# Patient Record
Sex: Male | Born: 1935 | Race: Asian | Hispanic: No | Marital: Married | Smoking: Never smoker
Health system: Southern US, Community
[De-identification: ages and names within clinical notes are randomized; demographics above are authoritative.]

## PROBLEM LIST (undated history)

## (undated) DIAGNOSIS — M1990 Unspecified osteoarthritis, unspecified site: Secondary | ICD-10-CM

## (undated) DIAGNOSIS — Z952 Presence of prosthetic heart valve: Principal | ICD-10-CM

## (undated) DIAGNOSIS — I77810 Thoracic aortic ectasia: Secondary | ICD-10-CM

## (undated) DIAGNOSIS — E785 Hyperlipidemia, unspecified: Secondary | ICD-10-CM

## (undated) DIAGNOSIS — I10 Essential (primary) hypertension: Secondary | ICD-10-CM

## (undated) DIAGNOSIS — I351 Nonrheumatic aortic (valve) insufficiency: Secondary | ICD-10-CM

## (undated) HISTORY — DX: Unspecified osteoarthritis, unspecified site: M19.90

## (undated) HISTORY — DX: Essential (primary) hypertension: I10

## (undated) HISTORY — DX: Presence of prosthetic heart valve: Z95.2

## (undated) HISTORY — DX: Thoracic aortic ectasia: I77.810

## (undated) HISTORY — DX: Hyperlipidemia, unspecified: E78.5

## (undated) HISTORY — DX: Nonrheumatic aortic (valve) insufficiency: I35.1

---

## 2010-10-29 HISTORY — PX: CARDIAC CATHETERIZATION: SHX172

## 2010-11-07 ENCOUNTER — Inpatient Hospital Stay (HOSPITAL_BASED_OUTPATIENT_CLINIC_OR_DEPARTMENT_OTHER)
Admission: RE | Admit: 2010-11-07 | Discharge: 2010-11-07 | Disposition: A | Discharge status: Home / Self Care | Payer: Medicare Other | Source: Ambulatory Visit | Attending: Cardiology | Admitting: Cardiology

## 2010-11-07 DIAGNOSIS — I2510 Atherosclerotic heart disease of native coronary artery without angina pectoris: Secondary | ICD-10-CM | POA: Insufficient documentation

## 2010-11-07 DIAGNOSIS — R9439 Abnormal result of other cardiovascular function study: Secondary | ICD-10-CM | POA: Insufficient documentation

## 2010-11-07 DIAGNOSIS — N189 Chronic kidney disease, unspecified: Secondary | ICD-10-CM | POA: Insufficient documentation

## 2010-11-07 DIAGNOSIS — I359 Nonrheumatic aortic valve disorder, unspecified: Secondary | ICD-10-CM | POA: Insufficient documentation

## 2010-11-07 DIAGNOSIS — I77819 Aortic ectasia, unspecified site: Secondary | ICD-10-CM | POA: Insufficient documentation

## 2010-11-08 DIAGNOSIS — I77810 Thoracic aortic ectasia: Secondary | ICD-10-CM

## 2010-11-08 DIAGNOSIS — E785 Hyperlipidemia, unspecified: Secondary | ICD-10-CM | POA: Insufficient documentation

## 2010-11-08 DIAGNOSIS — I10 Essential (primary) hypertension: Secondary | ICD-10-CM | POA: Insufficient documentation

## 2010-11-08 DIAGNOSIS — I351 Nonrheumatic aortic (valve) insufficiency: Secondary | ICD-10-CM | POA: Insufficient documentation

## 2010-11-09 ENCOUNTER — Other Ambulatory Visit: Payer: Self-pay | Admitting: Cardiothoracic Surgery

## 2010-11-09 ENCOUNTER — Institutional Professional Consult (permissible substitution) (INDEPENDENT_AMBULATORY_CARE_PROVIDER_SITE_OTHER): Payer: Medicare Other | Admitting: Cardiothoracic Surgery

## 2010-11-09 ENCOUNTER — Encounter: Payer: Self-pay | Admitting: Cardiothoracic Surgery

## 2010-11-09 VITALS — BP 117/72 | HR 109 | Temp 98.8°F | Resp 16 | Ht 66.0 in | Wt 154.0 lb

## 2010-11-09 DIAGNOSIS — I359 Nonrheumatic aortic valve disorder, unspecified: Secondary | ICD-10-CM

## 2010-11-09 DIAGNOSIS — I712 Thoracic aortic aneurysm, without rupture, unspecified: Secondary | ICD-10-CM

## 2010-11-09 DIAGNOSIS — I351 Nonrheumatic aortic (valve) insufficiency: Secondary | ICD-10-CM

## 2010-11-09 NOTE — Progress Notes (Signed)
PCP is No primary provider on file. Referring Provider is Jake Bathe., MD  Chief Complaint  Patient presents with  . Thoracic Aortic Aneurysm    and aortic valvular disease    HPI: I was asked to evaluate this 77 year old [place] male for Merkel therapy recently diagnosed severe aortic insufficiency and a moderate descending aortic fusiform aneurysm. The patient presented for treatment initially of his right knee which has severe arthritis and a total knee replacement was requested. Heart surgery cardiology clearance was performed. The patient was noted to have a cardiac murmur and a 2-D echo was performed showing moderate aortic root dilatation with a diameter of 4.6 cm of the descending aorta, LVEF of 50%, moderate to severe aortic insufficiency, moderate LVH. Patient underwent a stress test which was positive for ischemia in the anterior septal area. A subsequent cardiac catheter with coronary arteriography by Dr. Donato Schultz demonstrated percent stenosis of a septal perforator otherwise no significant coronary disease. The injection of the aortic root demonstrated dilatation of a fusiform aneurysm configuration. She was therefore referred for thoracic surgical evaluation for probable aortic valve replacement and hostile replacement of descending aorta.  Patient has no known history of aortic valve disease, cardiac murmur, history of rheumatic heart disease, or history of arrhythmia angina or CHF. No family history of heart valve disease or heart valve surgery. Patient never smoked.  Past medical history hypertension, hyperlipidemia, known drug allergies, or arthritis of the right knee requiring total knee replacement  Medications simvastatin 40 mg each bedtime losartan 100 mg daily when necessary Tylenol  Social history patient is retired he is never smoker. He drinks alcohol rarely. He was wife. Is retired Psychiatric nurse.  Review of systems constitutional review is negative for fever  weight loss. He review is significant for total dental extraction with total dental plates. Thoracic review is negative for prior thoracic, or recent symptoms of upper respiratory  infection. GI review is negative for history of hepatitis jaundice or blood per rectum. Her logic views negative for symptoms of UTI. Vascular neurologic review is negative for stroke or seizure. review negative for DVT claudication TIA or stroke. Hematologic negative for bleeding disorder or blood transfusion.  Past Medical History  Diagnosis Date  . HTN (hypertension)   . Hyperlipidemia   . Aortic insufficiency   . Aortic root dilatation     No past surgical history on file.  No family history on file.  Social History History  Substance Use Topics  . Smoking status: Never Smoker   . Smokeless tobacco: Not on file  . Alcohol Use: Yes     rare    Current Outpatient Prescriptions  Medication Sig Dispense Refill  . losartan (COZAAR) 100 MG tablet Take 100 mg by mouth daily.        . simvastatin (ZOCOR) 40 MG tablet Take 40 mg by mouth at bedtime.          No Known Allergies  Review of Systems: As above.  BP 117/72  Pulse 109  Temp(Src) 98.8 F (37.1 C) (Oral)  Resp 16  Ht 5\' 6"  (1.676 m)  Wt 154 lb (69.854 kg)  BMI 24.86 kg/m2  SpO2 95% Physical Exam:  General appearance is that of a elderly Asian male accompanied by 2 sons in no acute distress. ENT positive for total dental plates. Neck without JVD mass bruit or adenopathy. Thorax without deformity, breath sounds clear bilaterally. Cardiac exam with regular rhythm with a grade 2/6 systolic murmur. Abdominal exam  soft nontender without pulsatile mass. Extremities reveal no clubbing tenderness or cyanosis. Vascular exam negative for venous insufficiency of the lower chairman knees, positive for palpable pulses in the extremities. Neurologic exam alert and oriented without focal motor deficit. He has a normal  gait.                       Diagnostic Tests: His 2-D echo report and the cardiac catheter are reviewed. He has severe AI, moderate aortic root dilatation, and no significant coronary disease.  Impression: Severe AI with moderate dilatation and a fusiform aneurysm of the ascending aorta. The patient would be best treated with aortic valve replacement using a biologic valve and probable replacement of the ascending aorta with a Dacron graft.  Plan: The patient will return for a CT angiogram of the thoracic aorta and at that time we will schedule the day of surgery.

## 2010-11-09 NOTE — Patient Instructions (Signed)
Continue current meds and return to office after CT scan 9-14

## 2010-11-10 ENCOUNTER — Other Ambulatory Visit: Payer: Self-pay | Admitting: Cardiothoracic Surgery

## 2010-11-10 LAB — BUN: BUN: 29 mg/dL — ABNORMAL HIGH (ref 6–23)

## 2010-11-10 LAB — CREATININE, SERUM: Creat: 1.68 mg/dL — ABNORMAL HIGH (ref 0.50–1.35)

## 2010-11-10 NOTE — Cardiovascular Report (Signed)
Javier Gonzalez, Javier Gonzalez                  ACCOUNT NO.:  [id number]  MEDICAL RECORD NO.:  [id number]  LOCATION:                                 FACILITY:  PHYSICIAN:  Jake Bathe, MD      DATE OF BIRTH:  1931-12-05  DATE OF PROCEDURE:  11/07/2010 DATE OF DISCHARGE:                           CARDIAC CATHETERIZATION   INDICATIONS:  A 77 year old male who was originally seen by me for preoperative knee surgery clearance who had EKG showing nonspecific ST-T wave changes with areas of T-wave inversion in lead V6, V3, possible ischemia who underwent nuclear stress test, which showed some inferior lateral wall ischemia, possible mid anteroseptal wall ischemia with mildly reduced ejection fraction of 50% with septal hypokinesis.  The aortic root on echocardiogram was 4 cm at the site of Valsalva and 4.6 cm at the ascending aorta and there was what looked like moderate-to- severe aortic valve regurgitation.  His creatinine at one point was 1.58, prior to catheterization was 1.2, and bicarb protocol was performed.  PROCEDURE DETAILS:  Informed consent was obtained.  Risk of stroke, heart attack, death, renal impairment, arterial damage, bleeding were discussed with the patient with the aid of the translator.  Alternatives treatments discussed.  Possibility of renal impairment was discussed with family.  The femoral head was viewed via fluoroscopy.  A 4-French sheath was inserted in the right femoral artery without difficulty utilizing 1% lidocaine for local anesthesia.  A Judkins left #4 catheter did not fit within the left main artery due to aortic root dilatation. This was then changed to a JL-5, which successfully cannulated the left main artery.  Multiple views with hand injection of Omnipaque were obtained.  No torque Williams right catheter was used to selectively cannulate the right coronary artery.  Multiple views with hand injection of Omnipaque were obtained.  Angled pigtail was used  to cross the left ventricle.  A left ventriculogram utilizing 10 mL of contrast was performed.  Pullback was obtained then and aortogram involving the root was performed to assess his aortic insufficiency as well as aortic root size.  30 mL of contrast was utilized.  Following procedure, catheters were removed, sheaths were removed, and he was hemodynamically stable. Translator was present for assistance.  10 mg of hydralazine was administered because of blood pressures in the 170s to 190s systolic.  FINDINGS: 1. Left main artery - branches into the circumflex as well as LAD.     There is no angiographically significant disease present. 2. LAD.  This vessel gives rise to 2 diagonal branches.  There are two     large septal branches, the second of which is just prior to the     bifurcation of the first diagonal branch.  This second septal     branch appears to have an 80% ostial stenosis.  Stenting in this     region would jeopardize the remainder of the LAD distribution. 3. Circumflex artery.  This vessel is widely patent with minor luminal     irregularities giving rise to three large obtuse marginal branches.     No angiographically significant disease is present.  Right coronary  artery is also large, tortuous, and gives rise to the posterior     descending artery as well as two large posterior lateral branches.     This is the dominant vessel.  There are minor luminal     irregularities throughout this vasculature.  LEFT VENTRICULOGRAM: 1. EF appears approximately 60%.  Left ventricle was underfilled due     to only 10 mL of use. 2. Aortogram at root.  Significant findings are that the left     ventricle opacifies within one to two beats, which is significant     for 4+ aortic regurgitation.  His aortic root is also dilated, note     46 mm on echocardiogram.  His distal aorta is tortuous.  I did not     performed distal aortogram due to dye-sparing procedure.  Total      approximate IV dye usage was 70 mL.  TOTAL FLUORO TIME:  5.5 minutes.  HEMODYNAMICS:  Left ventricular systolic pressure 180 with an end- diastolic pressure of 19 mmHg.  Aortic pressure 181/99 with a mean of 135 mmHg.  There is no gradient.  IMPRESSION: 1. Second septal branch of left anterior descending distribution has     approximately 80% stenosis.  This may be responsible for the     anteroseptal wall mid ischemia seen on nuclear stress test.     Otherwise, coronary arteries do not demonstrate any significant     luminal abnormalities. 2. Severe aortic regurgitation with left ventricle filling within the     first one to two beats with aortogram.  Normal left ventricular     ejection fraction. 3. Dilated aortic root - echocardiogram demonstrated 46 mm. 4. Chronic kidney disease - creatinine prior to procedure was 1.26.     Previously, he had a creatinine in the 1.5 range.  Treated with     bicarbonate.  I will see him back in clinic in 1 week for followup.  With his shortness of breath, aortic insufficiency, and dilated aortic root, I will consult TCTS for further recommendations.  It may be necessary to perform a transesophageal echocardiogram.  They may also wish to perform CT angiogram of his aorta to fully illustrate and measure.  For now, postpone knee surgery.     Jake Bathe, MD     MCS/MEDQ  D:  11/07/2010  T:  11/07/2010  Job:  [id number]  Electronically Signed by Donato Schultz MD on 11/10/2010 06:15:43 AM

## 2010-11-11 ENCOUNTER — Ambulatory Visit
Admission: RE | Admit: 2010-11-11 | Discharge: 2010-11-11 | Disposition: A | Discharge status: Home / Self Care | Payer: Medicare Other | Source: Ambulatory Visit | Attending: Cardiothoracic Surgery | Admitting: Cardiothoracic Surgery

## 2010-11-11 ENCOUNTER — Ambulatory Visit (INDEPENDENT_AMBULATORY_CARE_PROVIDER_SITE_OTHER): Payer: Medicare Other | Admitting: Cardiothoracic Surgery

## 2010-11-11 VITALS — BP 127/72 | HR 92 | Resp 18 | Ht 66.0 in | Wt 154.0 lb

## 2010-11-11 DIAGNOSIS — I359 Nonrheumatic aortic valve disorder, unspecified: Secondary | ICD-10-CM

## 2010-11-11 DIAGNOSIS — I712 Thoracic aortic aneurysm, without rupture: Secondary | ICD-10-CM

## 2010-11-11 DIAGNOSIS — I77819 Aortic ectasia, unspecified site: Secondary | ICD-10-CM

## 2010-11-11 MED ORDER — IOHEXOL 300 MG/ML  SOLN
50.0000 mL | Freq: Once | INTRAMUSCULAR | Status: AC | PRN
  Administered 2010-11-11: 50 mL via INTRAVENOUS

## 2010-11-11 NOTE — Progress Notes (Signed)
HPI the patient is a 77 year old [place] and yard male with hypertension severe AI and a 4.8 cm descending to form aneurysm. His creatinine is 1.6 and he has mild class III symptoms of CHF. He has severe arthritis in his right knee and is pending right knee replacement.  He returns now for followup after review of his CT angiogram of the thoracic aorta. This demonstrates the fusiform aneurysm. The maximum diameter is 4.8 cm. He arch and descending thoracic aorta appeared normal. The sinuses of Valsalva are not dilated.  Based on these findings he would probably be best treated with an aVR (tissue) and replacement of the ascending aorta with a Dacron graft.   Current Outpatient Prescriptions  Medication Sig Dispense Refill  . losartan (COZAAR) 100 MG tablet Take 100 mg by mouth daily.        . simvastatin (ZOCOR) 40 MG tablet Take 40 mg by mouth at bedtime.         No current facility-administered medications for this visit.   Facility-Administered Medications Ordered in Other Visits  Medication Dose Route Frequency Provider Last Rate Last Dose  . iohexol (OMNIPAQUE) 300 MG/ML injection 50 mL  50 mL Intravenous Once PRN Medication Radiologist   50 mL at 11/11/10 1453     Review of Systems: No new issues.   Physical Exam blood pressure 127/72 is 90 and regular he is in no distress. Breath sounds are clear equal. Pulses are 2+ in all extremities. He has a soft systolic murmur grade 2/6. Oral is intact.   Diagnostic Tests: CT scan of the chest with IV contrast is reviewed as above.   Impression: Severe aortic insufficiency with moderate descending fusiform aneurysm  Plan: Aortic valve replacement with a tissue valve and probable placement of the ascending aorta with a straight graft on September 21. The procedure indications benefits risks and alternatives were discussed with the patient through his grandson as an interpreter.

## 2010-11-11 NOTE — Patient Instructions (Addendum)
take your blood pressure pill with sip of water before coming to hospital Sept 21st.

## 2010-11-16 ENCOUNTER — Encounter (HOSPITAL_COMMUNITY)
Admission: RE | Admit: 2010-11-16 | Discharge: 2010-11-16 | Disposition: A | Discharge status: Home / Self Care | Payer: Medicare Other | Source: Ambulatory Visit | Attending: Cardiothoracic Surgery | Admitting: Cardiothoracic Surgery

## 2010-11-16 ENCOUNTER — Other Ambulatory Visit: Payer: Self-pay | Admitting: Cardiothoracic Surgery

## 2010-11-16 ENCOUNTER — Ambulatory Visit (HOSPITAL_COMMUNITY)
Admission: RE | Admit: 2010-11-16 | Discharge: 2010-11-16 | Disposition: A | Discharge status: Home / Self Care | Payer: Medicare Other | Source: Ambulatory Visit

## 2010-11-16 ENCOUNTER — Ambulatory Visit (HOSPITAL_COMMUNITY)
Admission: RE | Admit: 2010-11-16 | Discharge: 2010-11-16 | Disposition: A | Discharge status: Home / Self Care | Payer: Medicare Other | Source: Ambulatory Visit | Attending: Cardiothoracic Surgery | Admitting: Cardiothoracic Surgery

## 2010-11-16 DIAGNOSIS — Z01812 Encounter for preprocedural laboratory examination: Secondary | ICD-10-CM | POA: Insufficient documentation

## 2010-11-16 DIAGNOSIS — I359 Nonrheumatic aortic valve disorder, unspecified: Secondary | ICD-10-CM | POA: Insufficient documentation

## 2010-11-16 DIAGNOSIS — I351 Nonrheumatic aortic (valve) insufficiency: Secondary | ICD-10-CM

## 2010-11-16 DIAGNOSIS — Z01810 Encounter for preprocedural cardiovascular examination: Secondary | ICD-10-CM

## 2010-11-16 DIAGNOSIS — I2510 Atherosclerotic heart disease of native coronary artery without angina pectoris: Secondary | ICD-10-CM | POA: Insufficient documentation

## 2010-11-16 LAB — ABO/RH: ABO/RH(D): AB POS

## 2010-11-16 LAB — CBC
HCT: 37.9 % — ABNORMAL LOW (ref 39.0–52.0)
Hemoglobin: 12.6 g/dL — ABNORMAL LOW (ref 13.0–17.0)
MCH: 23.7 pg — ABNORMAL LOW (ref 26.0–34.0)
MCHC: 33.2 g/dL (ref 30.0–36.0)
MCV: 71.4 fL — ABNORMAL LOW (ref 78.0–100.0)
Platelets: 161 10*3/uL (ref 150–400)
RBC: 5.31 MIL/uL (ref 4.22–5.81)
RDW: 15.2 % (ref 11.5–15.5)
WBC: 5.5 10*3/uL (ref 4.0–10.5)

## 2010-11-16 LAB — BLOOD GAS, ARTERIAL
Acid-Base Excess: 0 mmol/L (ref 0.0–2.0)
Bicarbonate: 23.7 mEq/L (ref 20.0–24.0)
Drawn by: 644381
FIO2: 0.21 %
O2 Saturation: 97.3 %
Patient temperature: 98.6
TCO2: 24.7 mmol/L (ref 0–100)
pCO2 arterial: 35.3 mmHg (ref 35.0–45.0)
pH, Arterial: 7.441 (ref 7.350–7.450)
pO2, Arterial: 83.3 mmHg (ref 80.0–100.0)

## 2010-11-16 LAB — URINALYSIS, ROUTINE W REFLEX MICROSCOPIC
Bilirubin Urine: NEGATIVE
Glucose, UA: NEGATIVE mg/dL
Ketones, ur: NEGATIVE mg/dL
Leukocytes, UA: NEGATIVE
Nitrite: NEGATIVE
Protein, ur: NEGATIVE mg/dL
Specific Gravity, Urine: 1.019 (ref 1.005–1.030)
Urobilinogen, UA: 0.2 mg/dL (ref 0.0–1.0)
pH: 5 (ref 5.0–8.0)

## 2010-11-16 LAB — URINE MICROSCOPIC-ADD ON

## 2010-11-16 LAB — COMPREHENSIVE METABOLIC PANEL
ALT: 15 U/L (ref 0–53)
AST: 15 U/L (ref 0–37)
Albumin: 3.7 g/dL (ref 3.5–5.2)
Alkaline Phosphatase: 48 U/L (ref 39–117)
BUN: 23 mg/dL (ref 6–23)
CO2: 23 mEq/L (ref 19–32)
Calcium: 9.4 mg/dL (ref 8.4–10.5)
Chloride: 106 mEq/L (ref 96–112)
Creatinine, Ser: 1.45 mg/dL — ABNORMAL HIGH (ref 0.50–1.35)
GFR calc Af Amer: 57 mL/min — ABNORMAL LOW (ref >60)
GFR calc non Af Amer: 47 mL/min — ABNORMAL LOW (ref >60)
Glucose, Bld: 127 mg/dL — ABNORMAL HIGH (ref 70–99)
Potassium: 3.9 mEq/L (ref 3.5–5.1)
Sodium: 139 mEq/L (ref 135–145)
Total Bilirubin: 0.5 mg/dL (ref 0.3–1.2)
Total Protein: 7.3 g/dL (ref 6.0–8.3)

## 2010-11-16 LAB — PROTIME-INR
INR: 1.04 (ref 0.00–1.49)
Prothrombin Time: 13.8 seconds (ref 11.6–15.2)

## 2010-11-16 LAB — APTT: aPTT: 28 seconds (ref 24–37)

## 2010-11-16 LAB — SURGICAL PCR SCREEN
MRSA, PCR: NEGATIVE
Staphylococcus aureus: NEGATIVE

## 2010-11-16 LAB — PULMONARY FUNCTION TEST

## 2010-11-17 HISTORY — PX: TRANSTHORACIC ECHOCARDIOGRAM: SHX275

## 2010-11-17 LAB — HEMOGLOBIN A1C
Hgb A1c MFr Bld: 7.1 % — ABNORMAL HIGH (ref <5.7)
Mean Plasma Glucose: 157 mg/dL — ABNORMAL HIGH (ref <117)

## 2010-11-18 ENCOUNTER — Other Ambulatory Visit: Payer: Self-pay | Admitting: Cardiothoracic Surgery

## 2010-11-18 ENCOUNTER — Inpatient Hospital Stay (HOSPITAL_COMMUNITY): Payer: Medicare Other

## 2010-11-18 ENCOUNTER — Inpatient Hospital Stay (HOSPITAL_COMMUNITY)
Admission: RE | Admit: 2010-11-18 | Discharge: 2010-11-28 | DRG: 220 | Disposition: A | Discharge status: Home Health | Payer: Medicare Other | Source: Ambulatory Visit | Attending: Cardiothoracic Surgery | Admitting: Cardiothoracic Surgery

## 2010-11-18 DIAGNOSIS — K224 Dyskinesia of esophagus: Secondary | ICD-10-CM | POA: Diagnosis not present

## 2010-11-18 DIAGNOSIS — T462X5A Adverse effect of other antidysrhythmic drugs, initial encounter: Secondary | ICD-10-CM | POA: Diagnosis not present

## 2010-11-18 DIAGNOSIS — I712 Thoracic aortic aneurysm, without rupture, unspecified: Secondary | ICD-10-CM | POA: Diagnosis present

## 2010-11-18 DIAGNOSIS — D62 Acute posthemorrhagic anemia: Secondary | ICD-10-CM | POA: Diagnosis not present

## 2010-11-18 DIAGNOSIS — I2510 Atherosclerotic heart disease of native coronary artery without angina pectoris: Secondary | ICD-10-CM | POA: Diagnosis present

## 2010-11-18 DIAGNOSIS — D696 Thrombocytopenia, unspecified: Secondary | ICD-10-CM | POA: Diagnosis not present

## 2010-11-18 DIAGNOSIS — Z79899 Other long term (current) drug therapy: Secondary | ICD-10-CM

## 2010-11-18 DIAGNOSIS — Z01818 Encounter for other preprocedural examination: Secondary | ICD-10-CM

## 2010-11-18 DIAGNOSIS — Z01810 Encounter for preprocedural cardiovascular examination: Secondary | ICD-10-CM

## 2010-11-18 DIAGNOSIS — I4891 Unspecified atrial fibrillation: Secondary | ICD-10-CM | POA: Diagnosis not present

## 2010-11-18 DIAGNOSIS — K644 Residual hemorrhoidal skin tags: Secondary | ICD-10-CM | POA: Diagnosis not present

## 2010-11-18 DIAGNOSIS — I519 Heart disease, unspecified: Secondary | ICD-10-CM | POA: Diagnosis not present

## 2010-11-18 DIAGNOSIS — I359 Nonrheumatic aortic valve disorder, unspecified: Principal | ICD-10-CM | POA: Diagnosis present

## 2010-11-18 DIAGNOSIS — E8779 Other fluid overload: Secondary | ICD-10-CM | POA: Diagnosis not present

## 2010-11-18 DIAGNOSIS — I2789 Other specified pulmonary heart diseases: Secondary | ICD-10-CM | POA: Diagnosis present

## 2010-11-18 DIAGNOSIS — Z01812 Encounter for preprocedural laboratory examination: Secondary | ICD-10-CM

## 2010-11-18 DIAGNOSIS — E119 Type 2 diabetes mellitus without complications: Secondary | ICD-10-CM | POA: Diagnosis present

## 2010-11-18 DIAGNOSIS — I10 Essential (primary) hypertension: Secondary | ICD-10-CM | POA: Diagnosis present

## 2010-11-18 DIAGNOSIS — Z7982 Long term (current) use of aspirin: Secondary | ICD-10-CM

## 2010-11-18 DIAGNOSIS — R112 Nausea with vomiting, unspecified: Secondary | ICD-10-CM | POA: Diagnosis not present

## 2010-11-18 DIAGNOSIS — R1319 Other dysphagia: Secondary | ICD-10-CM | POA: Diagnosis not present

## 2010-11-18 DIAGNOSIS — Z952 Presence of prosthetic heart valve: Secondary | ICD-10-CM

## 2010-11-18 DIAGNOSIS — Y9210 Unspecified residential institution as the place of occurrence of the external cause: Secondary | ICD-10-CM | POA: Diagnosis not present

## 2010-11-18 DIAGNOSIS — E785 Hyperlipidemia, unspecified: Secondary | ICD-10-CM | POA: Diagnosis present

## 2010-11-18 DIAGNOSIS — Y834 Other reconstructive surgery as the cause of abnormal reaction of the patient, or of later complication, without mention of misadventure at the time of the procedure: Secondary | ICD-10-CM | POA: Diagnosis not present

## 2010-11-18 HISTORY — PX: AORTIC VALVE REPLACEMENT: SHX41

## 2010-11-18 HISTORY — DX: Presence of prosthetic heart valve: Z95.2

## 2010-11-18 LAB — POCT I-STAT 4, (NA,K, GLUC, HGB,HCT)
Glucose, Bld: 115 mg/dL — ABNORMAL HIGH (ref 70–99)
Glucose, Bld: 126 mg/dL — ABNORMAL HIGH (ref 70–99)
Glucose, Bld: 126 mg/dL — ABNORMAL HIGH (ref 70–99)
Glucose, Bld: 139 mg/dL — ABNORMAL HIGH (ref 70–99)
Glucose, Bld: 154 mg/dL — ABNORMAL HIGH (ref 70–99)
Glucose, Bld: 153 mg/dL — ABNORMAL HIGH (ref 70–99)
HCT: 38 % — ABNORMAL LOW (ref 39.0–52.0)
HCT: 27 % — ABNORMAL LOW (ref 39.0–52.0)
HCT: 36 % — ABNORMAL LOW (ref 39.0–52.0)
HCT: 27 % — ABNORMAL LOW (ref 39.0–52.0)
HCT: 27 % — ABNORMAL LOW (ref 39.0–52.0)
HCT: 30 % — ABNORMAL LOW (ref 39.0–52.0)
Hemoglobin: 12.9 g/dL — ABNORMAL LOW (ref 13.0–17.0)
Hemoglobin: 12.2 g/dL — ABNORMAL LOW (ref 13.0–17.0)
Hemoglobin: 9.2 g/dL — ABNORMAL LOW (ref 13.0–17.0)
Hemoglobin: 9.2 g/dL — ABNORMAL LOW (ref 13.0–17.0)
Hemoglobin: 9.2 g/dL — ABNORMAL LOW (ref 13.0–17.0)
Hemoglobin: 10.2 g/dL — ABNORMAL LOW (ref 13.0–17.0)
Potassium: 3.9 mEq/L (ref 3.5–5.1)
Potassium: 3.8 mEq/L (ref 3.5–5.1)
Potassium: 3.9 mEq/L (ref 3.5–5.1)
Potassium: 5.2 mEq/L — ABNORMAL HIGH (ref 3.5–5.1)
Potassium: 5 mEq/L (ref 3.5–5.1)
Potassium: 4 mEq/L (ref 3.5–5.1)
Sodium: 141 mEq/L (ref 135–145)
Sodium: 134 mEq/L — ABNORMAL LOW (ref 135–145)
Sodium: 140 mEq/L (ref 135–145)
Sodium: 135 mEq/L (ref 135–145)
Sodium: 134 mEq/L — ABNORMAL LOW (ref 135–145)
Sodium: 139 mEq/L (ref 135–145)

## 2010-11-18 LAB — POCT I-STAT 3, ART BLOOD GAS (G3+)
Acid-Base Excess: 2 mmol/L (ref 0.0–2.0)
Acid-base deficit: 5 mmol/L — ABNORMAL HIGH (ref 0.0–2.0)
Bicarbonate: 26.6 mEq/L — ABNORMAL HIGH (ref 20.0–24.0)
Bicarbonate: 23.3 mEq/L (ref 20.0–24.0)
Bicarbonate: 20.4 mEq/L (ref 20.0–24.0)
Bicarbonate: 24.7 mEq/L — ABNORMAL HIGH (ref 20.0–24.0)
O2 Saturation: 100 %
O2 Saturation: 100 %
O2 Saturation: 93 %
O2 Saturation: 97 %
Patient temperature: 35.9
Patient temperature: 36.5
TCO2: 28 mmol/L (ref 0–100)
TCO2: 24 mmol/L (ref 0–100)
TCO2: 22 mmol/L (ref 0–100)
TCO2: 26 mmol/L (ref 0–100)
pCO2 arterial: 40.1 mmHg (ref 35.0–45.0)
pCO2 arterial: 33.9 mmHg — ABNORMAL LOW (ref 35.0–45.0)
pCO2 arterial: 36.2 mmHg (ref 35.0–45.0)
pCO2 arterial: 41 mmHg (ref 35.0–45.0)
pH, Arterial: 7.429 (ref 7.350–7.450)
pH, Arterial: 7.444 (ref 7.350–7.450)
pH, Arterial: 7.353 (ref 7.350–7.450)
pH, Arterial: 7.386 (ref 7.350–7.450)
pO2, Arterial: 398 mmHg — ABNORMAL HIGH (ref 80.0–100.0)
pO2, Arterial: 263 mmHg — ABNORMAL HIGH (ref 80.0–100.0)
pO2, Arterial: 65 mmHg — ABNORMAL LOW (ref 80.0–100.0)
pO2, Arterial: 92 mmHg (ref 80.0–100.0)

## 2010-11-18 LAB — PROTIME-INR
INR: 1.62 — ABNORMAL HIGH (ref 0.00–1.49)
Prothrombin Time: 19.5 seconds — ABNORMAL HIGH (ref 11.6–15.2)

## 2010-11-18 LAB — POCT I-STAT, CHEM 8
BUN: 15 mg/dL (ref 6–23)
Calcium, Ion: 1.2 mmol/L (ref 1.12–1.32)
Chloride: 106 mEq/L (ref 96–112)
Creatinine, Ser: 1.2 mg/dL (ref 0.50–1.35)
Glucose, Bld: 151 mg/dL — ABNORMAL HIGH (ref 70–99)
HCT: 26 % — ABNORMAL LOW (ref 39.0–52.0)
Hemoglobin: 8.8 g/dL — ABNORMAL LOW (ref 13.0–17.0)
Potassium: 4 mEq/L (ref 3.5–5.1)
Sodium: 139 mEq/L (ref 135–145)
TCO2: 22 mmol/L (ref 0–100)

## 2010-11-18 LAB — CBC
HCT: 29.5 % — ABNORMAL LOW (ref 39.0–52.0)
HCT: 26.2 % — ABNORMAL LOW (ref 39.0–52.0)
Hemoglobin: 9.8 g/dL — ABNORMAL LOW (ref 13.0–17.0)
Hemoglobin: 8.6 g/dL — ABNORMAL LOW (ref 13.0–17.0)
MCH: 23.5 pg — ABNORMAL LOW (ref 26.0–34.0)
MCH: 23.2 pg — ABNORMAL LOW (ref 26.0–34.0)
MCHC: 33.2 g/dL (ref 30.0–36.0)
MCHC: 32.8 g/dL (ref 30.0–36.0)
MCV: 70.7 fL — ABNORMAL LOW (ref 78.0–100.0)
MCV: 70.8 fL — ABNORMAL LOW (ref 78.0–100.0)
Platelets: 125 10*3/uL — ABNORMAL LOW (ref 150–400)
Platelets: 110 10*3/uL — ABNORMAL LOW (ref 150–400)
RBC: 4.17 MIL/uL — ABNORMAL LOW (ref 4.22–5.81)
RBC: 3.7 MIL/uL — ABNORMAL LOW (ref 4.22–5.81)
RDW: 15 % (ref 11.5–15.5)
RDW: 15 % (ref 11.5–15.5)
WBC: 10.8 10*3/uL — ABNORMAL HIGH (ref 4.0–10.5)
WBC: 7.8 10*3/uL (ref 4.0–10.5)

## 2010-11-18 LAB — CREATININE, SERUM
Creatinine, Ser: 0.99 mg/dL (ref 0.50–1.35)
GFR calc Af Amer: >60 mL/min (ref >60)
GFR calc non Af Amer: >60 mL/min (ref >60)

## 2010-11-18 LAB — POCT I-STAT GLUCOSE
Glucose, Bld: 134 mg/dL — ABNORMAL HIGH (ref 70–99)
Operator id: 3342

## 2010-11-18 LAB — HEMOGLOBIN AND HEMATOCRIT, BLOOD
HCT: 25.5 % — ABNORMAL LOW (ref 39.0–52.0)
Hemoglobin: 8.5 g/dL — ABNORMAL LOW (ref 13.0–17.0)

## 2010-11-18 LAB — PLATELET COUNT: Platelets: 107 10*3/uL — ABNORMAL LOW (ref 150–400)

## 2010-11-18 LAB — MAGNESIUM: Magnesium: 3 mg/dL — ABNORMAL HIGH (ref 1.5–2.5)

## 2010-11-18 LAB — APTT: aPTT: 49 seconds — ABNORMAL HIGH (ref 24–37)

## 2010-11-19 ENCOUNTER — Inpatient Hospital Stay (HOSPITAL_COMMUNITY): Payer: Medicare Other

## 2010-11-19 DIAGNOSIS — E1165 Type 2 diabetes mellitus with hyperglycemia: Secondary | ICD-10-CM

## 2010-11-19 DIAGNOSIS — IMO0001 Reserved for inherently not codable concepts without codable children: Secondary | ICD-10-CM

## 2010-11-19 LAB — POCT I-STAT, CHEM 8
BUN: 18 mg/dL (ref 6–23)
Calcium, Ion: 1.17 mmol/L (ref 1.12–1.32)
Chloride: 104 mEq/L (ref 96–112)
Creatinine, Ser: 1.7 mg/dL — ABNORMAL HIGH (ref 0.50–1.35)
Glucose, Bld: 165 mg/dL — ABNORMAL HIGH (ref 70–99)
HCT: 24 % — ABNORMAL LOW (ref 39.0–52.0)
Hemoglobin: 8.2 g/dL — ABNORMAL LOW (ref 13.0–17.0)
Potassium: 3.8 mEq/L (ref 3.5–5.1)
Sodium: 141 mEq/L (ref 135–145)
TCO2: 21 mmol/L (ref 0–100)

## 2010-11-19 LAB — BASIC METABOLIC PANEL
BUN: 14 mg/dL (ref 6–23)
CO2: 26 mEq/L (ref 19–32)
Calcium: 7.8 mg/dL — ABNORMAL LOW (ref 8.4–10.5)
Chloride: 109 mEq/L (ref 96–112)
Creatinine, Ser: 1.06 mg/dL (ref 0.50–1.35)
GFR calc Af Amer: >60 mL/min (ref >60)
GFR calc non Af Amer: >60 mL/min (ref >60)
Glucose, Bld: 95 mg/dL (ref 70–99)
Potassium: 3.8 mEq/L (ref 3.5–5.1)
Sodium: 141 mEq/L (ref 135–145)

## 2010-11-19 LAB — PREPARE FRESH FROZEN PLASMA: Unit division: 0

## 2010-11-19 LAB — POCT I-STAT 3, ART BLOOD GAS (G3+)
Acid-base deficit: 6 mmol/L — ABNORMAL HIGH (ref 0.0–2.0)
Acid-base deficit: 1 mmol/L (ref 0.0–2.0)
Bicarbonate: 18.3 mEq/L — ABNORMAL LOW (ref 20.0–24.0)
Bicarbonate: 23.5 mEq/L (ref 20.0–24.0)
O2 Saturation: 94 %
O2 Saturation: 93 %
Patient temperature: 36.5
Patient temperature: 37.4
TCO2: 19 mmol/L (ref 0–100)
TCO2: 25 mmol/L (ref 0–100)
pCO2 arterial: 30.8 mmHg — ABNORMAL LOW (ref 35.0–45.0)
pCO2 arterial: 37 mmHg (ref 35.0–45.0)
pH, Arterial: 7.38 (ref 7.350–7.450)
pH, Arterial: 7.412 (ref 7.350–7.450)
pO2, Arterial: 69 mmHg — ABNORMAL LOW (ref 80.0–100.0)
pO2, Arterial: 66 mmHg — ABNORMAL LOW (ref 80.0–100.0)

## 2010-11-19 LAB — GLUCOSE, CAPILLARY
Glucose-Capillary: 108 mg/dL — ABNORMAL HIGH (ref 70–99)
Glucose-Capillary: 110 mg/dL — ABNORMAL HIGH (ref 70–99)
Glucose-Capillary: 117 mg/dL — ABNORMAL HIGH (ref 70–99)
Glucose-Capillary: 120 mg/dL — ABNORMAL HIGH (ref 70–99)
Glucose-Capillary: 122 mg/dL — ABNORMAL HIGH (ref 70–99)
Glucose-Capillary: 126 mg/dL — ABNORMAL HIGH (ref 70–99)
Glucose-Capillary: 127 mg/dL — ABNORMAL HIGH (ref 70–99)
Glucose-Capillary: 129 mg/dL — ABNORMAL HIGH (ref 70–99)
Glucose-Capillary: 133 mg/dL — ABNORMAL HIGH (ref 70–99)
Glucose-Capillary: 141 mg/dL — ABNORMAL HIGH (ref 70–99)
Glucose-Capillary: 164 mg/dL — ABNORMAL HIGH (ref 70–99)

## 2010-11-19 LAB — CBC
HCT: 22.7 % — ABNORMAL LOW (ref 39.0–52.0)
HCT: 22.8 % — ABNORMAL LOW (ref 39.0–52.0)
Hemoglobin: 7.5 g/dL — ABNORMAL LOW (ref 13.0–17.0)
Hemoglobin: 7.5 g/dL — ABNORMAL LOW (ref 13.0–17.0)
MCH: 23.3 pg — ABNORMAL LOW (ref 26.0–34.0)
MCH: 23.5 pg — ABNORMAL LOW (ref 26.0–34.0)
MCHC: 33 g/dL (ref 30.0–36.0)
MCHC: 32.9 g/dL (ref 30.0–36.0)
MCV: 70.5 fL — ABNORMAL LOW (ref 78.0–100.0)
MCV: 71.5 fL — ABNORMAL LOW (ref 78.0–100.0)
Platelets: 80 10*3/uL — ABNORMAL LOW (ref 150–400)
Platelets: 71 10*3/uL — ABNORMAL LOW (ref 150–400)
RBC: 3.22 MIL/uL — ABNORMAL LOW (ref 4.22–5.81)
RBC: 3.19 MIL/uL — ABNORMAL LOW (ref 4.22–5.81)
RDW: 15.1 % (ref 11.5–15.5)
RDW: 15.5 % (ref 11.5–15.5)
WBC: 7.9 10*3/uL (ref 4.0–10.5)
WBC: 8.4 10*3/uL (ref 4.0–10.5)

## 2010-11-19 LAB — PREPARE PLATELET PHERESIS: Unit division: 0

## 2010-11-19 LAB — MAGNESIUM
Magnesium: 2.5 mg/dL (ref 1.5–2.5)
Magnesium: 2.3 mg/dL (ref 1.5–2.5)

## 2010-11-19 LAB — CREATININE, SERUM
Creatinine, Ser: 1.45 mg/dL — ABNORMAL HIGH (ref 0.50–1.35)
GFR calc Af Amer: 57 mL/min — ABNORMAL LOW (ref >60)
GFR calc non Af Amer: 47 mL/min — ABNORMAL LOW (ref >60)

## 2010-11-20 ENCOUNTER — Inpatient Hospital Stay (HOSPITAL_COMMUNITY): Payer: Medicare Other

## 2010-11-20 LAB — CBC
HCT: 26.2 % — ABNORMAL LOW (ref 39.0–52.0)
Hemoglobin: 8.7 g/dL — ABNORMAL LOW (ref 13.0–17.0)
MCH: 24.6 pg — ABNORMAL LOW (ref 26.0–34.0)
MCHC: 33.2 g/dL (ref 30.0–36.0)
MCV: 74 fL — ABNORMAL LOW (ref 78.0–100.0)
Platelets: 65 10*3/uL — ABNORMAL LOW (ref 150–400)
RBC: 3.54 MIL/uL — ABNORMAL LOW (ref 4.22–5.81)
RDW: 16.7 % — ABNORMAL HIGH (ref 11.5–15.5)
WBC: 8.4 10*3/uL (ref 4.0–10.5)

## 2010-11-20 LAB — GLUCOSE, CAPILLARY
Glucose-Capillary: 100 mg/dL — ABNORMAL HIGH (ref 70–99)
Glucose-Capillary: 103 mg/dL — ABNORMAL HIGH (ref 70–99)
Glucose-Capillary: 104 mg/dL — ABNORMAL HIGH (ref 70–99)
Glucose-Capillary: 105 mg/dL — ABNORMAL HIGH (ref 70–99)
Glucose-Capillary: 107 mg/dL — ABNORMAL HIGH (ref 70–99)
Glucose-Capillary: 112 mg/dL — ABNORMAL HIGH (ref 70–99)
Glucose-Capillary: 125 mg/dL — ABNORMAL HIGH (ref 70–99)
Glucose-Capillary: 127 mg/dL — ABNORMAL HIGH (ref 70–99)
Glucose-Capillary: 129 mg/dL — ABNORMAL HIGH (ref 70–99)
Glucose-Capillary: 155 mg/dL — ABNORMAL HIGH (ref 70–99)
Glucose-Capillary: 162 mg/dL — ABNORMAL HIGH (ref 70–99)
Glucose-Capillary: 58 mg/dL — ABNORMAL LOW (ref 70–99)
Glucose-Capillary: 83 mg/dL (ref 70–99)
Glucose-Capillary: 94 mg/dL (ref 70–99)
Glucose-Capillary: 96 mg/dL (ref 70–99)
Glucose-Capillary: 98 mg/dL (ref 70–99)
Glucose-Capillary: 98 mg/dL (ref 70–99)

## 2010-11-20 LAB — BASIC METABOLIC PANEL
BUN: 18 mg/dL (ref 6–23)
CO2: 28 mEq/L (ref 19–32)
Calcium: 7.8 mg/dL — ABNORMAL LOW (ref 8.4–10.5)
Chloride: 104 mEq/L (ref 96–112)
Creatinine, Ser: 1.31 mg/dL (ref 0.50–1.35)
GFR calc Af Amer: >60 mL/min (ref >60)
GFR calc non Af Amer: 53 mL/min — ABNORMAL LOW (ref >60)
Glucose, Bld: 134 mg/dL — ABNORMAL HIGH (ref 70–99)
Potassium: 3.5 mEq/L (ref 3.5–5.1)
Sodium: 139 mEq/L (ref 135–145)

## 2010-11-20 LAB — TYPE AND SCREEN
ABO/RH(D): AB POS
Antibody Screen: NEGATIVE
Unit division: 0

## 2010-11-21 ENCOUNTER — Inpatient Hospital Stay (HOSPITAL_COMMUNITY): Payer: Medicare Other

## 2010-11-21 DIAGNOSIS — IMO0001 Reserved for inherently not codable concepts without codable children: Secondary | ICD-10-CM

## 2010-11-21 DIAGNOSIS — E1165 Type 2 diabetes mellitus with hyperglycemia: Secondary | ICD-10-CM

## 2010-11-21 LAB — COMPREHENSIVE METABOLIC PANEL
ALT: 12 U/L (ref 0–53)
AST: 15 U/L (ref 0–37)
Albumin: 2.8 g/dL — ABNORMAL LOW (ref 3.5–5.2)
Alkaline Phosphatase: 34 U/L — ABNORMAL LOW (ref 39–117)
BUN: 20 mg/dL (ref 6–23)
CO2: 30 mEq/L (ref 19–32)
Calcium: 7.9 mg/dL — ABNORMAL LOW (ref 8.4–10.5)
Chloride: 102 mEq/L (ref 96–112)
Creatinine, Ser: 1.28 mg/dL (ref 0.50–1.35)
GFR calc Af Amer: >60 mL/min (ref >60)
GFR calc non Af Amer: 55 mL/min — ABNORMAL LOW (ref >60)
Glucose, Bld: 75 mg/dL (ref 70–99)
Potassium: 3.2 mEq/L — ABNORMAL LOW (ref 3.5–5.1)
Sodium: 136 mEq/L (ref 135–145)
Total Bilirubin: 1 mg/dL (ref 0.3–1.2)
Total Protein: 5.7 g/dL — ABNORMAL LOW (ref 6.0–8.3)

## 2010-11-21 LAB — CBC
HCT: 23.5 % — ABNORMAL LOW (ref 39.0–52.0)
Hemoglobin: 7.9 g/dL — ABNORMAL LOW (ref 13.0–17.0)
MCH: 24.6 pg — ABNORMAL LOW (ref 26.0–34.0)
MCHC: 33.6 g/dL (ref 30.0–36.0)
MCV: 73.2 fL — ABNORMAL LOW (ref 78.0–100.0)
Platelets: 66 10*3/uL — ABNORMAL LOW (ref 150–400)
RBC: 3.21 MIL/uL — ABNORMAL LOW (ref 4.22–5.81)
RDW: 16.3 % — ABNORMAL HIGH (ref 11.5–15.5)
WBC: 8.1 10*3/uL (ref 4.0–10.5)

## 2010-11-21 LAB — GLUCOSE, CAPILLARY
Glucose-Capillary: 116 mg/dL — ABNORMAL HIGH (ref 70–99)
Glucose-Capillary: 118 mg/dL — ABNORMAL HIGH (ref 70–99)
Glucose-Capillary: 124 mg/dL — ABNORMAL HIGH (ref 70–99)
Glucose-Capillary: 132 mg/dL — ABNORMAL HIGH (ref 70–99)
Glucose-Capillary: 135 mg/dL — ABNORMAL HIGH (ref 70–99)
Glucose-Capillary: 138 mg/dL — ABNORMAL HIGH (ref 70–99)
Glucose-Capillary: 173 mg/dL — ABNORMAL HIGH (ref 70–99)
Glucose-Capillary: 76 mg/dL (ref 70–99)
Glucose-Capillary: 85 mg/dL (ref 70–99)

## 2010-11-22 ENCOUNTER — Inpatient Hospital Stay (HOSPITAL_COMMUNITY): Payer: Medicare Other

## 2010-11-22 LAB — BASIC METABOLIC PANEL
BUN: 22 mg/dL (ref 6–23)
CO2: 28 mEq/L (ref 19–32)
Calcium: 8.1 mg/dL — ABNORMAL LOW (ref 8.4–10.5)
Chloride: 104 mEq/L (ref 96–112)
Creatinine, Ser: 1.22 mg/dL (ref 0.50–1.35)
GFR calc Af Amer: >60 mL/min (ref >60)
GFR calc non Af Amer: 58 mL/min — ABNORMAL LOW (ref >60)
Glucose, Bld: 109 mg/dL — ABNORMAL HIGH (ref 70–99)
Potassium: 4.1 mEq/L (ref 3.5–5.1)
Sodium: 139 mEq/L (ref 135–145)

## 2010-11-22 LAB — CBC
HCT: 25.4 % — ABNORMAL LOW (ref 39.0–52.0)
Hemoglobin: 8.5 g/dL — ABNORMAL LOW (ref 13.0–17.0)
MCH: 24.5 pg — ABNORMAL LOW (ref 26.0–34.0)
MCHC: 33.5 g/dL (ref 30.0–36.0)
MCV: 73.2 fL — ABNORMAL LOW (ref 78.0–100.0)
Platelets: 105 10*3/uL — ABNORMAL LOW (ref 150–400)
RBC: 3.47 MIL/uL — ABNORMAL LOW (ref 4.22–5.81)
RDW: 16.6 % — ABNORMAL HIGH (ref 11.5–15.5)
WBC: 7 10*3/uL (ref 4.0–10.5)

## 2010-11-22 LAB — HEPARIN INDUCED THROMBOCYTOPENIA PNL
Heparin Induced Plt Ab: NEGATIVE
Patient O.D.: 0.136
UFH High Dose UFH H: 0 % Release
UFH Low Dose 0.1 IU/mL: 0 % Release
UFH Low Dose 0.5 IU/mL: 0 % Release
UFH SRA Result: NEGATIVE

## 2010-11-22 LAB — GLUCOSE, CAPILLARY
Glucose-Capillary: 110 mg/dL — ABNORMAL HIGH (ref 70–99)
Glucose-Capillary: 112 mg/dL — ABNORMAL HIGH (ref 70–99)
Glucose-Capillary: 105 mg/dL — ABNORMAL HIGH (ref 70–99)
Glucose-Capillary: 95 mg/dL (ref 70–99)
Glucose-Capillary: 123 mg/dL — ABNORMAL HIGH (ref 70–99)

## 2010-11-23 ENCOUNTER — Inpatient Hospital Stay (HOSPITAL_COMMUNITY): Payer: Medicare Other

## 2010-11-23 DIAGNOSIS — IMO0001 Reserved for inherently not codable concepts without codable children: Secondary | ICD-10-CM

## 2010-11-23 DIAGNOSIS — E1165 Type 2 diabetes mellitus with hyperglycemia: Secondary | ICD-10-CM

## 2010-11-23 LAB — GLUCOSE, CAPILLARY
Glucose-Capillary: 125 mg/dL — ABNORMAL HIGH (ref 70–99)
Glucose-Capillary: 108 mg/dL — ABNORMAL HIGH (ref 70–99)
Glucose-Capillary: 143 mg/dL — ABNORMAL HIGH (ref 70–99)
Glucose-Capillary: 114 mg/dL — ABNORMAL HIGH (ref 70–99)

## 2010-11-23 NOTE — Op Note (Signed)
Javier Gonzalez, Javier Gonzalez                    ACCOUNT NO.:  [id number]  MEDICAL RECORD NO.:  [id number]  LOCATION:  2314                         FACILITY:  [facility]  PHYSICIAN:  Kerin Perna, M.D.  DATE OF BIRTH:  March 08, 1931  DATE OF PROCEDURE:  11/18/2010 DATE OF DISCHARGE:                              OPERATIVE REPORT   OPERATION: 1. Aortic valve replacement with a 23-mm biologic tissue valve     [facility] Ease, serial number [id number]). 2. Placement of right femoral A-line for blood pressure monitoring.  SURGEON:  Kerin Perna, MD  ASSISTANT:  Coral Ceo, PA-C.  PRE/POSTOPERATIVE DIAGNOSIS:  Severe aortic insufficiency.  ANESTHESIA:  General by Dr. Adonis Huguenin.  INDICATIONS:  The patient is a 77 year old  Asian male with an abnormal stress test.  The patient presented with right knee arthritis for knee replacement and a Cardiology clearance was requested.  Dr. Donato Schultz examined the patient and heard a murmur and did a 2-D echo.  This showed moderate-to-severe aortic insufficiency with LV dilatation.  The aortic groove was mildly to moderately enlarged at 4.4 cm.  The patient underwent a cardiac cath, which showed mild-to-moderate pulmonary hypertension, moderate-to-severe aortic insufficiency, and minimal coronary artery disease with a stenosis of a septal perforator of the LAD.  Overall, LVEF was fairly well-preserved by echo as well as ventriculogram.  The patient was felt to be a candidate for surgical repair and replacement of the aortic valve.  There is also question of an ascending aneurysm.  I examined the patient in the office, reviewing his cardiac cath and echo with the patient and his family.  I also obtained a CT angiogram of his thoracic aorta, which measured the maximal transverse diameter of 4.40 cm.  There was no evidence of mural hematoma or penetrating ulcer or dissection.  I recommended the patient that he undergo aortic valve replacement for his  severe AI.  I told the patient and family that I would exam the aorta at the time of surgery and make a final determination where it should be replaced.  The patient and family understood that replacement of the ascending aorta would add significant lengths of the surgery and some risk.  After reviewing the indications, benefits, and risks of aortic valve replacement and possible aortic replacement of the ascending aorta, the patient agreed to proceed.  We discussed specifically the risks to him of bleeding, blood transfusion requirement, infection, stroke, MI, and death.  The patient understood that we were recommending a biologic valve due to his age of 77 years. Since the patient does not speak English well,  all of this information was discussed and provided to the patient through family and his interpreters.  OPERATIVE FINDINGS: 1. Trileaflet aortic valve with severe incompetence. 2. Moderate LV dilatation. 3. Mild-to-moderate fusiform aneurysmal dilatation of the ascending     aorta measuring 4.4 centimeters in diameter.  PROCEDURE:  The patient was brought to the operating room and placed supine on the operating table where general anesthesia was induced.  A transesophageal 2-D echo probe was placed by the anesthesiologist which confirmed the preoperative diagnosis of moderate-to-severe AI.  The patient was  prepped and draped as a sterile field.  A sternal incision was made.  A sternotomy was performed.  There was unusual amount of bleeding felt from this part of the surgery.  Of note, the baseline ACT was 154 seconds.  A sternal retractor was then placed and the pericardium was opened.  There was minimal pericardial effusion.  The sternum was retracted and the pericardium was suspended.  The aorta was carefully examined.  It seemed to be somewhat dilated, but not significantly enough to warrant a replacement.  It was somewhat friable and thin.  The patient was then heparinized  and pursestrings were placed in the ascending and in the right atrium.  When the ACT was documented as being therapeutic, the patient was cannulated and placed on cardiopulmonary bypass.  A vent was placed via the right superior pulmonary vein and the patient was cooled to 32 degrees.  Cardioplegia catheters were placed both antegrade and retrograde cold blood cardioplegia and the crossclamp was applied.  1 liter of cold blood cardioplegia was delivered in split doses between the antegrade catheter (approximately 200-300 mL) and the coronary sinus retrograde catheter (600-700 mL).  Topical hypothermia with slush was also provided.  A transverse aortotomy was performed.  The valve was carefully examined. There were no significant plaques in the aorta.  The annulus had mild calcification.  The leaflets and annulus were excised and debrided.  The outflow tract was irrigated.  The valve sized to 23 Magna Ease valve. Sutures of 2-0 Ethibond with horizontal pledgets were placed around the annulus, numbering 13 total.  The valve was selected and prepared according to the protocol.  The sutures were placed through the sewing ring of the valve.  The valve was seated and sutures were tied.  The valve had excellent confirmation to the annulus without evidence of spaces or perivalvular leak.  The coronary ostia widely patent.  The aortotomy was closed with a running #4 Prolene in two layers.  Prior to removing the crossclamp, air was vented from the coronaries with a dose of retrograde warm blood cardioplegia in the usual de-airing maneuvers on bypass.  The crossclamp was then removed.  The heart had resumed a spontaneous rhythm.  The aortotomy was checked and hemostasis was documented.  The patient was rewarmed to 37 degrees and temporary pacing wires were applied in the right ventricle and right atrium.  The lungs were expanded.  The ventilator was resumed.  The patient was then weaned from  bypass on low-dose dobutamine due to his moderately elevated creatinine preop.  The patient came off bypass well with stable hemodynamics and good cardiac output.  Protamine was administered without adverse reaction.  The cannulas removed.  There is some bleeding at the aortic cannulation site and this was controlled completely with additional pledgets with 4-0 Prolene suture.  The patient still had diffuse coagulopathy and a platelet count of 101,000 so platelets were administered.  The mediastinum was irrigated.  An anterior mediastinal tube was placed and brought out through separate incision.  The superior pericardial fat was closed over the aorta.  The sternum was closed with interrupted steel wires. The pectoralis fascia and subcutaneous layers were closed with running Vicryl and sterile dressings were applied.  Total cardiopulmonary bypass time was 80 minutes.     Kerin Perna, M.D.     PV/MEDQ  D:  11/18/2010  T:  11/19/2010  Job:  [id number]  cc:   Jake Bathe, MD  Electronically Signed by Theron Arista  Donata Clay M.D. on 11/23/2010 08:27:04 AM

## 2010-11-24 ENCOUNTER — Inpatient Hospital Stay (HOSPITAL_COMMUNITY): Payer: Medicare Other

## 2010-11-24 DIAGNOSIS — E1165 Type 2 diabetes mellitus with hyperglycemia: Secondary | ICD-10-CM

## 2010-11-24 DIAGNOSIS — IMO0001 Reserved for inherently not codable concepts without codable children: Secondary | ICD-10-CM

## 2010-11-24 LAB — COMPREHENSIVE METABOLIC PANEL
ALT: 14 U/L (ref 0–53)
AST: 14 U/L (ref 0–37)
Albumin: 3 g/dL — ABNORMAL LOW (ref 3.5–5.2)
Alkaline Phosphatase: 46 U/L (ref 39–117)
BUN: 29 mg/dL — ABNORMAL HIGH (ref 6–23)
CO2: 30 mEq/L (ref 19–32)
Calcium: 8.3 mg/dL — ABNORMAL LOW (ref 8.4–10.5)
Chloride: 103 mEq/L (ref 96–112)
Creatinine, Ser: 1.32 mg/dL (ref 0.50–1.35)
GFR calc Af Amer: >60 mL/min (ref >60)
GFR calc non Af Amer: 53 mL/min — ABNORMAL LOW (ref >60)
Glucose, Bld: 95 mg/dL (ref 70–99)
Potassium: 4 mEq/L (ref 3.5–5.1)
Sodium: 139 mEq/L (ref 135–145)
Total Bilirubin: 0.8 mg/dL (ref 0.3–1.2)
Total Protein: 6.4 g/dL (ref 6.0–8.3)

## 2010-11-24 LAB — GLUCOSE, CAPILLARY
Glucose-Capillary: 106 mg/dL — ABNORMAL HIGH (ref 70–99)
Glucose-Capillary: 103 mg/dL — ABNORMAL HIGH (ref 70–99)
Glucose-Capillary: 113 mg/dL — ABNORMAL HIGH (ref 70–99)
Glucose-Capillary: 100 mg/dL — ABNORMAL HIGH (ref 70–99)

## 2010-11-24 LAB — CBC
HCT: 26 % — ABNORMAL LOW (ref 39.0–52.0)
Hemoglobin: 8.6 g/dL — ABNORMAL LOW (ref 13.0–17.0)
MCH: 24.7 pg — ABNORMAL LOW (ref 26.0–34.0)
MCHC: 33.1 g/dL (ref 30.0–36.0)
MCV: 74.7 fL — ABNORMAL LOW (ref 78.0–100.0)
Platelets: 139 10*3/uL — ABNORMAL LOW (ref 150–400)
RBC: 3.48 MIL/uL — ABNORMAL LOW (ref 4.22–5.81)
RDW: 17 % — ABNORMAL HIGH (ref 11.5–15.5)
WBC: 6.8 10*3/uL (ref 4.0–10.5)

## 2010-11-24 LAB — AMYLASE: Amylase: 53 U/L (ref 0–105)

## 2010-11-25 ENCOUNTER — Inpatient Hospital Stay (HOSPITAL_COMMUNITY): Payer: Medicare Other

## 2010-11-25 DIAGNOSIS — IMO0001 Reserved for inherently not codable concepts without codable children: Secondary | ICD-10-CM

## 2010-11-25 DIAGNOSIS — I319 Disease of pericardium, unspecified: Secondary | ICD-10-CM

## 2010-11-25 DIAGNOSIS — E1165 Type 2 diabetes mellitus with hyperglycemia: Secondary | ICD-10-CM

## 2010-11-25 LAB — BASIC METABOLIC PANEL
BUN: 21 mg/dL (ref 6–23)
CO2: 27 mEq/L (ref 19–32)
Calcium: 8.2 mg/dL — ABNORMAL LOW (ref 8.4–10.5)
Chloride: 105 mEq/L (ref 96–112)
Creatinine, Ser: 1.14 mg/dL (ref 0.50–1.35)
GFR calc Af Amer: >60 mL/min (ref >60)
GFR calc non Af Amer: >60 mL/min (ref >60)
Glucose, Bld: 94 mg/dL (ref 70–99)
Potassium: 4 mEq/L (ref 3.5–5.1)
Sodium: 139 mEq/L (ref 135–145)

## 2010-11-25 LAB — GLUCOSE, CAPILLARY
Glucose-Capillary: 96 mg/dL (ref 70–99)
Glucose-Capillary: 86 mg/dL (ref 70–99)
Glucose-Capillary: 101 mg/dL — ABNORMAL HIGH (ref 70–99)
Glucose-Capillary: 88 mg/dL (ref 70–99)

## 2010-11-26 LAB — CBC
HCT: 28.2 % — ABNORMAL LOW (ref 39.0–52.0)
Hemoglobin: 8.9 g/dL — ABNORMAL LOW (ref 13.0–17.0)
MCH: 23.8 pg — ABNORMAL LOW (ref 26.0–34.0)
MCHC: 31.6 g/dL (ref 30.0–36.0)
MCV: 75.4 fL — ABNORMAL LOW (ref 78.0–100.0)
Platelets: 203 10*3/uL (ref 150–400)
RBC: 3.74 MIL/uL — ABNORMAL LOW (ref 4.22–5.81)
RDW: 16.7 % — ABNORMAL HIGH (ref 11.5–15.5)
WBC: 6.4 10*3/uL (ref 4.0–10.5)

## 2010-11-26 LAB — BASIC METABOLIC PANEL
BUN: 20 mg/dL (ref 6–23)
CO2: 26 mEq/L (ref 19–32)
Calcium: 8.5 mg/dL (ref 8.4–10.5)
Chloride: 103 mEq/L (ref 96–112)
Creatinine, Ser: 1.11 mg/dL (ref 0.50–1.35)
GFR calc Af Amer: >60 mL/min (ref >60)
GFR calc non Af Amer: >60 mL/min (ref >60)
Glucose, Bld: 84 mg/dL (ref 70–99)
Potassium: 4.1 mEq/L (ref 3.5–5.1)
Sodium: 137 mEq/L (ref 135–145)

## 2010-11-26 LAB — GLUCOSE, CAPILLARY
Glucose-Capillary: 91 mg/dL (ref 70–99)
Glucose-Capillary: 99 mg/dL (ref 70–99)
Glucose-Capillary: 130 mg/dL — ABNORMAL HIGH (ref 70–99)
Glucose-Capillary: 125 mg/dL — ABNORMAL HIGH (ref 70–99)

## 2010-11-27 LAB — GLUCOSE, CAPILLARY
Glucose-Capillary: 119 mg/dL — ABNORMAL HIGH (ref 70–99)
Glucose-Capillary: 133 mg/dL — ABNORMAL HIGH (ref 70–99)
Glucose-Capillary: 136 mg/dL — ABNORMAL HIGH (ref 70–99)
Glucose-Capillary: 103 mg/dL — ABNORMAL HIGH (ref 70–99)

## 2010-11-28 ENCOUNTER — Inpatient Hospital Stay (HOSPITAL_COMMUNITY): Payer: Medicare Other

## 2010-11-28 DIAGNOSIS — IMO0001 Reserved for inherently not codable concepts without codable children: Secondary | ICD-10-CM

## 2010-11-28 DIAGNOSIS — E1165 Type 2 diabetes mellitus with hyperglycemia: Secondary | ICD-10-CM

## 2010-11-28 LAB — GLUCOSE, CAPILLARY
Glucose-Capillary: 112 mg/dL — ABNORMAL HIGH (ref 70–99)
Glucose-Capillary: 118 mg/dL — ABNORMAL HIGH (ref 70–99)
Glucose-Capillary: 99 mg/dL (ref 70–99)

## 2010-11-28 LAB — DIGOXIN LEVEL: Digoxin Level: 0.9 ng/mL (ref 0.8–2.0)

## 2010-12-06 NOTE — Discharge Summary (Signed)
Javier Gonzalez, Javier Gonzalez                    ACCOUNT NO.:  [id number]  MEDICAL RECORD NO.:  [id number]  LOCATION:  2016                         FACILITY:  [facility]  PHYSICIAN:  Kerin Perna, M.D.  DATE OF BIRTH:  03-Jun-1931  DATE OF ADMISSION:  11/18/2010 DATE OF DISCHARGE:  11/28/2010                              DISCHARGE SUMMARY   FINAL DIAGNOSIS:  Severe aortic insufficiency.  IN-HOSPITAL DIAGNOSES: 1. Newly diagnosed diabetes mellitus with a hemoglobin A1c of 7.1. 2. Acute blood loss anemia postoperatively. 3. Postoperative atrial fibrillation. 4. Thrombocytopenia postoperatively. 5. Volume overload postoperatively. 6. External hemorrhoids postoperatively. 7. Esophageal dysmotility postoperatively.  SECONDARY DIAGNOSES: 1. Hypertension. 2. Hyperlipidemia. 3. History of arthritis, status post right total knee replacement.  IN-HOSPITAL OPERATIONS AND PROCEDURES:  Aortic valve replacement with a 23-mm biologic tissue valve, [employer] features editor.  HISTORY AND PHYSICAL AND HOSPITAL COURSE:  The patient is a 77 year old male who presented with right knee arthritis for a knee replacement. Cardiology clearance was requested.  Dr. Anne Fu examined the patient and heard a murmur and did a 2-D echocardiogram.  This showed moderate-to- severe aortic insufficiency with LV dilatation.  The aortic root was mildly-to-moderately enlarged at 4.4 cm.  The patient underwent cardiac catheterization, which showed mild-to-moderate pulmonary hypertension, moderate-to-severe aortic insufficiency, and minimal coronary artery disease with stenosis of the septal perforator of the LAD.  Overall, left ventricular ejection fraction was fairly well preserved by echo as well as ventriculogram.  The patient was referred to Dr. Donata Clay.  Dr. Donata Clay saw and evaluated the patient.  He discussed with the patient undergoing aortic valve replacement.  He discussed the risks and benefits with the patient.  The  patient acknowledged understanding and agreed to proceed.  Surgery was scheduled for November 18, 2010.  For further details of the patient's past medical history and physical exam, please see dictated H and P.  The patient was taken to the operating room on November 18, 2010, where he underwent an aortic valve replacement with a 23-mm biologic tissue valve, Animator.  The patient tolerated this procedure well and was transferred to the Surgical Intensive Care Unit in stable condition.  Postoperatively, the patient was noted to be hemodynamically stable.  He was extubated at the evening of surgery.  Postextubation, the patient was noted to be alert and oriented x4.  Neuro intact. During the patient's postoperative course, he was initially noted to be in normal sinus rhythm.  Blood pressure was stable.  He was started on low-dose beta-blocker.  On postop day #2, the patient went into rapid atrial fibrillation.  He was started on IV amiodarone.  He did convert back to normal sinus rhythm and was switched over to p.o. amiodarone. The patient was not felt to be a Coumadin candidate.  His heart rate was continued to be monitored.  On postop day #5, the patient became nauseous with emesis.  It was felt that this may be due to secondary to his amiodarone.  This was discontinued.  The patient's heart rate continued to be monitored and he did go back into rapid atrial fibrillation.  He was ultimately started on digoxin,  and Lopressor was increased.  He has continued to go in and out of the atrial fibrillation, but noted to be rate controlled.  On November 28, 2010, the patient has remained in normal sinus rhythm.  We will plan to continue at discharge, digoxin and Lopressor.  During this time, his blood pressure has been followed closely and has remained stable and well- controlled on current medications.  He can be evaluated as an outpatient and possibly restart his losartan and  Norvasc if blood pressure allows. Postoperatively, chest x-ray obtained on day #1 noted to be stable.  The patient had minimum drainage from chest tubes and chest tubes were discontinued in routine fashion.  Followup chest x-rays have remained stable with small bilateral pleural effusions and atelectasis.  The patient was encouraged to use his incentive spirometer.  Unfortunately, the patient was unable to be weaned off of oxygen.  He currently is on 2 liters of nasal cannula with O2 sats greater than 90%.  Plan to discharge the patient to home on oxygen.  Postoperatively, the patient did develop acute blood loss anemia.  His hemoglobin and hematocrit dropped to 7.5 and 22.8.  He did receive one unit of packed red blood cells.  Followup hemoglobin and hematocrit did improve appropriately. This was followed closely and has remained stable.  Postoperatively, the patient did have some mild volume overload and to receive several doses of diuretics.  Prior to discharge home, this had improved.  As stated above, the patient did develop nausea and emesis postoperatively.  His amiodarone has been discontinued.  Speech Pathology was consulted.  They felt that on clinical observation, the patient did have some esophageal dysphagia.  Esophagram/barium swallow was ordered and done on November 25, 2010.  This showed esophageal dysmotility with no fixed esophageal strictures or masses.  There is trace tracheal aspiration with thin barium liquids.  They recommended regular diet with thick liquids.  This diet was started and the patient currently tolerating well.  His nausea and vomiting had resolved.  Preoperatively, the patient did have lab work obtained, which showed a hemoglobin A1c of 7.1.  Blood sugars were followed closely postoperatively.  He was initially started on Lantus insulin.  Once tolerating diet, metformin was added.  Insulin was discontinued.  The patient's blood sugars are currently  well controlled on metformin.  He will need to follow up with his regular doctor for further management.  Also noted postoperatively, he did develop some external hemorrhoids.  He was started on Anusol cream.  Currently, his external hemorrhoids were stable at this time, but if these do become thrombosed, he will need to see a general surgeon.  Postoperatively, the patient was transferred out of SICU to 2000 on postop day #3.  While in the unit and on the floor, the patient was up ambulating with assistance.  His incisions are noted to be clean, dry and intact and healing well.  He did develop some thrombocytopenia postoperatively and a HIT panel was obtained.  This WAS noted to be negative.  The patient's platelet counts were monitored and did improve.  On November 28, 2010, the patient is noted to be progressing well.  He has remained in normal sinus rhythm.  Blood pressure was well controlled.  O2 sats greater than 90% on 2 liters.  His incisions are clean, dry and intact and healing well.  He is tolerating diet well.  The patient was seen by Dr. Donata Clay this morning and felt to be stable and  ready for discharge to home with home O2, on home health.  FOLLOWUP APPOINTMENTS:  A followup appointment has been arranged with Dr. Donata Clay for December 21, 2010 at 12:45 p.m.  The patient will need to obtain PA and lateral chest x-ray 1 hour prior to this appointment. He will need to follow up with Dr. Anne Fu in 2 weeks.  He will need to contact his office to make these arrangements.  ACTIVITY:  The patient is instructed no driving until released to do so, no heavy lifting over 10 pounds.  He is told to ambulate 3-4 times per day, progress as tolerated, and continue his breathing exercises.  INCISIONAL CARE:  The patient is told shower, washing his incisions using soap and water.  He is to contact the office if he develops any drainage or opening from any of his incision sites.  DIET:  The  patient educated on diet to be low-fat, low-salt as well as diabetic diet.  DISCHARGE MEDICATIONS: 1. Aspirin enteric-coated 325 mg daily. 2. Digoxin 0.25 mg daily. 3. Folic acid 1 mg daily. 4. Hydrocortisone  2.5% cream apply rectally b.i.d. 5. Metformin 500 mg b.i.d. 6. Lopressor 50 mg b.i.d. 7. Thick-It food thickener with meals. 8. Ultram 50 mg one to two tablets q.4 h. p.r.n. pain. 9. Colace 200 mg b.i.d.     Stephanie Acre Dasovich, PA   ______________________________ Kerin Perna, M.D.    KMD/MEDQ  D:  11/28/2010  T:  11/28/2010  Job:  [id number]  cc:   Jake Bathe, MD  Electronically Signed by Cameron Proud PA on 12/02/2010 08:57:43 AM Electronically Signed by Kerin Perna M.D. on 12/06/2010 05:28:56 PM

## 2010-12-15 ENCOUNTER — Other Ambulatory Visit: Payer: Self-pay | Admitting: Cardiothoracic Surgery

## 2010-12-15 DIAGNOSIS — I359 Nonrheumatic aortic valve disorder, unspecified: Secondary | ICD-10-CM

## 2010-12-20 DIAGNOSIS — Z952 Presence of prosthetic heart valve: Secondary | ICD-10-CM

## 2010-12-21 ENCOUNTER — Ambulatory Visit
Admission: RE | Admit: 2010-12-21 | Discharge: 2010-12-21 | Disposition: A | Discharge status: Home / Self Care | Payer: Medicare Other | Source: Ambulatory Visit | Attending: Cardiothoracic Surgery | Admitting: Cardiothoracic Surgery

## 2010-12-21 ENCOUNTER — Ambulatory Visit (INDEPENDENT_AMBULATORY_CARE_PROVIDER_SITE_OTHER): Payer: Self-pay | Admitting: Cardiothoracic Surgery

## 2010-12-21 ENCOUNTER — Encounter: Payer: Self-pay | Admitting: Cardiothoracic Surgery

## 2010-12-21 VITALS — BP 140/82 | HR 62 | Resp 16 | Ht 66.0 in | Wt 154.0 lb

## 2010-12-21 DIAGNOSIS — I359 Nonrheumatic aortic valve disorder, unspecified: Secondary | ICD-10-CM

## 2010-12-21 NOTE — Patient Instructions (Signed)
We will remove home Oxygen. Do not lift more than 15 pounds until Jan. You may schedule your knee surgery after Feb 28 2011. Continue metoprolo another 2 months.

## 2010-12-21 NOTE — Progress Notes (Signed)
HPI                   [address]            [place] [id number]          [phone]    The patient returns for one month followup after undergoing aortic valve replacement for severe aortic stenosis and insufficiency. 823 mm pericardial tissue valve was used. The patient has some respiratory issues following surgery and required discharge on home oxygen to maintain sats greater than 90%. He had glucose intolerance and required insulin and then low-dose metformin on discharge to control postop diabetes. He remained in a sinus rhythm but had PACs. He had a thrombosed hemorrhoid. All these issues prolonged his hospitalization somewhat but he is ultimately discharged home approximately 10 days following surgery on the above medications. Since returning home he has had gradual improvement. He has no symptoms of CHF or angina. The surgical incisions healing well. Is no edema. Is still taking some tramadol as needed.     Current Outpatient Prescriptions  Medication Sig Dispense Refill  . digoxin (LANOXIN) 0.25 MG tablet Take 250 mcg by mouth daily.        Marland Kitchen docusate sodium (COLACE) 100 MG capsule Take 200 mg by mouth 2 (two) times daily.        . folic acid (FOLVITE) 1 MG tablet Take 1 mg by mouth daily.        Marland Kitchen losartan (COZAAR) 100 MG tablet Take 100 mg by mouth daily.        . metFORMIN (GLUCOPHAGE) 500 MG tablet Take 500 mg by mouth 2 (two) times daily with a meal.        . metoprolol (LOPRESSOR) 50 MG tablet Take 50 mg by mouth 2 (two) times daily.        . simvastatin (ZOCOR) 40 MG tablet Take 40 mg by mouth at bedtime.        . traMADol (ULTRAM) 50 MG tablet Take 50 mg by mouth every 4 (four) hours as needed. Maximum dose= 8 tablets per day          Review of Systems: Overall his strength appetite sleeping and exercise tolerance are improving.  Physical Exam Vital signs blood pressure 145/70 pulse 60 and regular saturation 96% room air. He is alert and pleasant. A  [place] translator is present. Chest is clear bilaterally. The sternal incision is well-healed. Cardiac rhythm is regular. There is no significant cardiac murmur. Extremities are nontender without edema.    Diagnostic Tests: Chest x-ray reveals significant improvement in lung aeration. No pleural effusions. Cardiac silhouette stable. Sternal wires intact.  Impression: Stable course following aortic valve replacement for aortic stenosis and aortic insufficiency.  Plan:   The patient should be recovered adequately in 3 months to undergo orthopedic procedure, total knee replacement. I reviewed the medications with the patient in his family. I provided him with another prescription for metoprolol 50 mg twice a day. He'll discontinue the digoxin Colace folic acid and metformin 1 prescriptions route. He'll continue his losartan simvastatin 80 is been on long-term as well as another 2 months of metoprolol. I asked him to take an aspirin 81 mg daily for the protection of his prosthetic valve.  He'll return to see his cardiologist Dr. Anne Fu and then return here as needed. We'll arrange for home nursing to remove the home oxygen which he no longer needs.

## 2011-02-07 ENCOUNTER — Encounter: Payer: Self-pay | Admitting: *Deleted

## 2012-10-30 ENCOUNTER — Other Ambulatory Visit: Payer: Self-pay | Admitting: Gastroenterology

## 2013-12-19 ENCOUNTER — Encounter: Payer: Self-pay | Admitting: *Deleted

## 2013-12-29 ENCOUNTER — Encounter: Payer: Self-pay | Admitting: Internal Medicine

## 2015-10-22 ENCOUNTER — Encounter (HOSPITAL_COMMUNITY): Payer: Self-pay | Admitting: Emergency Medicine

## 2015-10-22 ENCOUNTER — Inpatient Hospital Stay (HOSPITAL_COMMUNITY)
Admission: EM | Admit: 2015-10-22 | Discharge: 2015-10-29 | DRG: 871 | Disposition: E | Payer: Medicare Other | Attending: Internal Medicine | Admitting: Internal Medicine

## 2015-10-22 ENCOUNTER — Inpatient Hospital Stay (HOSPITAL_COMMUNITY): Payer: Medicare Other

## 2015-10-22 ENCOUNTER — Inpatient Hospital Stay (HOSPITAL_COMMUNITY): Payer: Medicare Other | Admitting: Certified Registered Nurse Anesthetist

## 2015-10-22 ENCOUNTER — Other Ambulatory Visit (HOSPITAL_COMMUNITY): Payer: Self-pay | Admitting: Family Medicine

## 2015-10-22 ENCOUNTER — Emergency Department (HOSPITAL_COMMUNITY): Payer: Medicare Other

## 2015-10-22 DIAGNOSIS — G9340 Encephalopathy, unspecified: Secondary | ICD-10-CM | POA: Diagnosis present

## 2015-10-22 DIAGNOSIS — J189 Pneumonia, unspecified organism: Secondary | ICD-10-CM | POA: Diagnosis not present

## 2015-10-22 DIAGNOSIS — R778 Other specified abnormalities of plasma proteins: Secondary | ICD-10-CM | POA: Diagnosis present

## 2015-10-22 DIAGNOSIS — J90 Pleural effusion, not elsewhere classified: Secondary | ICD-10-CM

## 2015-10-22 DIAGNOSIS — I4581 Long QT syndrome: Secondary | ICD-10-CM

## 2015-10-22 DIAGNOSIS — Z953 Presence of xenogenic heart valve: Secondary | ICD-10-CM

## 2015-10-22 DIAGNOSIS — E1122 Type 2 diabetes mellitus with diabetic chronic kidney disease: Secondary | ICD-10-CM

## 2015-10-22 DIAGNOSIS — I469 Cardiac arrest, cause unspecified: Secondary | ICD-10-CM | POA: Diagnosis not present

## 2015-10-22 DIAGNOSIS — Z952 Presence of prosthetic heart valve: Secondary | ICD-10-CM

## 2015-10-22 DIAGNOSIS — N289 Disorder of kidney and ureter, unspecified: Secondary | ICD-10-CM

## 2015-10-22 DIAGNOSIS — J690 Pneumonitis due to inhalation of food and vomit: Secondary | ICD-10-CM | POA: Diagnosis present

## 2015-10-22 DIAGNOSIS — E872 Acidosis, unspecified: Secondary | ICD-10-CM | POA: Diagnosis present

## 2015-10-22 DIAGNOSIS — N183 Chronic kidney disease, stage 3 (moderate): Secondary | ICD-10-CM

## 2015-10-22 DIAGNOSIS — G9349 Other encephalopathy: Secondary | ICD-10-CM | POA: Diagnosis present

## 2015-10-22 DIAGNOSIS — E785 Hyperlipidemia, unspecified: Secondary | ICD-10-CM | POA: Diagnosis present

## 2015-10-22 DIAGNOSIS — Z7984 Long term (current) use of oral hypoglycemic drugs: Secondary | ICD-10-CM | POA: Diagnosis not present

## 2015-10-22 DIAGNOSIS — I10 Essential (primary) hypertension: Secondary | ICD-10-CM | POA: Diagnosis present

## 2015-10-22 DIAGNOSIS — A419 Sepsis, unspecified organism: Secondary | ICD-10-CM | POA: Diagnosis present

## 2015-10-22 DIAGNOSIS — I214 Non-ST elevation (NSTEMI) myocardial infarction: Secondary | ICD-10-CM | POA: Diagnosis present

## 2015-10-22 DIAGNOSIS — I5022 Chronic systolic (congestive) heart failure: Secondary | ICD-10-CM | POA: Diagnosis present

## 2015-10-22 DIAGNOSIS — I110 Hypertensive heart disease with heart failure: Secondary | ICD-10-CM | POA: Diagnosis present

## 2015-10-22 DIAGNOSIS — I700 Atherosclerosis of aorta: Secondary | ICD-10-CM | POA: Diagnosis present

## 2015-10-22 DIAGNOSIS — I272 Other secondary pulmonary hypertension: Secondary | ICD-10-CM | POA: Diagnosis present

## 2015-10-22 DIAGNOSIS — E860 Dehydration: Secondary | ICD-10-CM | POA: Diagnosis present

## 2015-10-22 DIAGNOSIS — E119 Type 2 diabetes mellitus without complications: Secondary | ICD-10-CM | POA: Diagnosis present

## 2015-10-22 DIAGNOSIS — R9431 Abnormal electrocardiogram [ECG] [EKG]: Secondary | ICD-10-CM | POA: Diagnosis present

## 2015-10-22 DIAGNOSIS — R1310 Dysphagia, unspecified: Secondary | ICD-10-CM

## 2015-10-22 DIAGNOSIS — Z79899 Other long term (current) drug therapy: Secondary | ICD-10-CM | POA: Diagnosis not present

## 2015-10-22 DIAGNOSIS — D649 Anemia, unspecified: Secondary | ICD-10-CM | POA: Diagnosis present

## 2015-10-22 DIAGNOSIS — Z954 Presence of other heart-valve replacement: Secondary | ICD-10-CM

## 2015-10-22 DIAGNOSIS — N179 Acute kidney failure, unspecified: Secondary | ICD-10-CM | POA: Diagnosis present

## 2015-10-22 DIAGNOSIS — I248 Other forms of acute ischemic heart disease: Secondary | ICD-10-CM | POA: Diagnosis not present

## 2015-10-22 DIAGNOSIS — R7989 Other specified abnormal findings of blood chemistry: Secondary | ICD-10-CM

## 2015-10-22 DIAGNOSIS — I2510 Atherosclerotic heart disease of native coronary artery without angina pectoris: Secondary | ICD-10-CM | POA: Diagnosis present

## 2015-10-22 DIAGNOSIS — R627 Adult failure to thrive: Secondary | ICD-10-CM | POA: Diagnosis present

## 2015-10-22 DIAGNOSIS — R0602 Shortness of breath: Secondary | ICD-10-CM | POA: Diagnosis present

## 2015-10-22 DIAGNOSIS — I77810 Thoracic aortic ectasia: Secondary | ICD-10-CM | POA: Diagnosis present

## 2015-10-22 DIAGNOSIS — I2489 Other forms of acute ischemic heart disease: Secondary | ICD-10-CM | POA: Diagnosis present

## 2015-10-22 DIAGNOSIS — J948 Other specified pleural conditions: Secondary | ICD-10-CM

## 2015-10-22 DIAGNOSIS — Z978 Presence of other specified devices: Secondary | ICD-10-CM

## 2015-10-22 LAB — I-STAT CG4 LACTIC ACID, ED
LACTIC ACID, VENOUS: 2.74 mmol/L — AB (ref 0.5–1.9)
Lactic Acid, Venous: 2.03 mmol/L (ref 0.5–1.9)

## 2015-10-22 LAB — URINE MICROSCOPIC-ADD ON

## 2015-10-22 LAB — COMPREHENSIVE METABOLIC PANEL
ALT: 31 U/L (ref 17–63)
AST: 32 U/L (ref 15–41)
Albumin: 3 g/dL — ABNORMAL LOW (ref 3.5–5.0)
Alkaline Phosphatase: 59 U/L (ref 38–126)
Anion gap: 10 (ref 5–15)
BILIRUBIN TOTAL: 1.8 mg/dL — AB (ref 0.3–1.2)
BUN: 92 mg/dL — AB (ref 6–20)
CHLORIDE: 116 mmol/L — AB (ref 101–111)
CO2: 21 mmol/L — ABNORMAL LOW (ref 22–32)
Calcium: 7.4 mg/dL — ABNORMAL LOW (ref 8.9–10.3)
Creatinine, Ser: 2.6 mg/dL — ABNORMAL HIGH (ref 0.61–1.24)
GFR, EST AFRICAN AMERICAN: 25 mL/min — AB (ref >60)
GFR, EST NON AFRICAN AMERICAN: 22 mL/min — AB (ref >60)
Glucose, Bld: 227 mg/dL — ABNORMAL HIGH (ref 65–99)
POTASSIUM: 3.7 mmol/L (ref 3.5–5.1)
Sodium: 147 mmol/L — ABNORMAL HIGH (ref 135–145)
TOTAL PROTEIN: 7.2 g/dL (ref 6.5–8.1)

## 2015-10-22 LAB — URINALYSIS, ROUTINE W REFLEX MICROSCOPIC
Bilirubin Urine: NEGATIVE
GLUCOSE, UA: NEGATIVE mg/dL
Ketones, ur: NEGATIVE mg/dL
LEUKOCYTES UA: NEGATIVE
Nitrite: NEGATIVE
PH: 5 (ref 5.0–8.0)
Protein, ur: 30 mg/dL — AB
Specific Gravity, Urine: 1.023 (ref 1.005–1.030)

## 2015-10-22 LAB — CBC
HEMATOCRIT: 30.6 % — AB (ref 39.0–52.0)
Hemoglobin: 9.9 g/dL — ABNORMAL LOW (ref 13.0–17.0)
MCH: 23.7 pg — ABNORMAL LOW (ref 26.0–34.0)
MCHC: 32.4 g/dL (ref 30.0–36.0)
MCV: 73.2 fL — ABNORMAL LOW (ref 78.0–100.0)
PLATELETS: 142 10*3/uL — AB (ref 150–400)
RBC: 4.18 MIL/uL — AB (ref 4.22–5.81)
RDW: 16.1 % — AB (ref 11.5–15.5)
WBC: 9.4 10*3/uL (ref 4.0–10.5)

## 2015-10-22 LAB — BRAIN NATRIURETIC PEPTIDE: B NATRIURETIC PEPTIDE 5: 305.5 pg/mL — AB (ref 0.0–100.0)

## 2015-10-22 LAB — LACTIC ACID, PLASMA: Lactic Acid, Venous: 4.7 mmol/L (ref 0.5–1.9)

## 2015-10-22 LAB — I-STAT TROPONIN, ED: Troponin i, poc: 1.86 ng/mL (ref 0.00–0.08)

## 2015-10-22 LAB — PROCALCITONIN: Procalcitonin: 0.58 ng/mL

## 2015-10-22 LAB — GLUCOSE, CAPILLARY: GLUCOSE-CAPILLARY: 247 mg/dL — AB (ref 65–99)

## 2015-10-22 LAB — TROPONIN I: TROPONIN I: 2.79 ng/mL — AB (ref <0.03)

## 2015-10-22 LAB — CBG MONITORING, ED: Glucose-Capillary: 200 mg/dL — ABNORMAL HIGH (ref 65–99)

## 2015-10-22 MED ORDER — ORAL CARE MOUTH RINSE: 15.0000 mL | Freq: Four times a day (QID) | OROMUCOSAL | Status: DC

## 2015-10-22 MED ORDER — DOXYCYCLINE HYCLATE 100 MG IV SOLR
100.0000 mg | Freq: Once | INTRAVENOUS | Status: AC
  Administered 2015-10-22: 100 mg via INTRAVENOUS
  Filled 2015-10-22: qty 100

## 2015-10-22 MED ORDER — SODIUM CHLORIDE 0.9 % IV BOLUS (SEPSIS): 500.0000 mL | Freq: Once | INTRAVENOUS | Status: DC

## 2015-10-22 MED ORDER — CHLORHEXIDINE GLUCONATE 0.12 % MT SOLN: 15.0000 mL | Freq: Two times a day (BID) | OROMUCOSAL | Status: DC

## 2015-10-22 MED ORDER — IPRATROPIUM-ALBUTEROL 0.5-2.5 (3) MG/3ML IN SOLN
3.0000 mL | Freq: Once | RESPIRATORY_TRACT | Status: AC
  Administered 2015-10-22: 3 mL via RESPIRATORY_TRACT
  Filled 2015-10-22: qty 3

## 2015-10-22 MED ORDER — DOPAMINE-DEXTROSE 3.2-5 MG/ML-% IV SOLN
INTRAVENOUS | Status: AC
  Filled 2015-10-22: qty 250

## 2015-10-22 MED ORDER — DEXTROSE 5 % IV SOLN
1.0000 g | Freq: Once | INTRAVENOUS | Status: AC
  Administered 2015-10-22: 1 g via INTRAVENOUS
  Filled 2015-10-22: qty 10

## 2015-10-22 MED ORDER — INSULIN ASPART 100 UNIT/ML ~~LOC~~ SOLN: 0.0000 [IU] | SUBCUTANEOUS | Status: DC

## 2015-10-22 MED ORDER — MIDAZOLAM HCL 2 MG/2ML IJ SOLN: 1.0000 mg | INTRAMUSCULAR | Status: DC | PRN

## 2015-10-22 MED ORDER — SODIUM CHLORIDE 0.9 % IV SOLN
INTRAVENOUS | Status: DC
  Administered 2015-10-22: 18:00:00 via INTRAVENOUS

## 2015-10-22 MED ORDER — DOXYCYCLINE HYCLATE 100 MG PO TABS: 100.0000 mg | ORAL_TABLET | Freq: Once | ORAL | Status: DC

## 2015-10-22 MED ORDER — SODIUM CHLORIDE 0.9 % IV BOLUS (SEPSIS)
1000.0000 mL | Freq: Once | INTRAVENOUS | Status: AC
  Administered 2015-10-22: 1000 mL via INTRAVENOUS

## 2015-10-22 MED ORDER — FENTANYL CITRATE (PF) 100 MCG/2ML IJ SOLN: 50.0000 ug | INTRAMUSCULAR | Status: DC | PRN

## 2015-10-22 MED ORDER — ASPIRIN 81 MG PO CHEW: 324.0000 mg | CHEWABLE_TABLET | Freq: Once | ORAL | Status: DC

## 2015-10-22 MED ORDER — ASPIRIN 300 MG RE SUPP
300.0000 mg | Freq: Once | RECTAL | Status: AC
Start: 2015-10-22 — End: 2015-10-22
  Administered 2015-10-22: 300 mg via RECTAL
  Filled 2015-10-22: qty 1

## 2015-10-22 MED ORDER — PIPERACILLIN-TAZOBACTAM IN DEX 2-0.25 GM/50ML IV SOLN
2.2500 g | Freq: Four times a day (QID) | INTRAVENOUS | Status: DC
  Filled 2015-10-22 (×3): qty 50

## 2015-10-22 MED ORDER — DOPAMINE-DEXTROSE 3.2-5 MG/ML-% IV SOLN: 0.0000 ug/kg/min | INTRAVENOUS | Status: DC

## 2015-10-22 NOTE — H&P (Signed)
Javier Gonzalez:[id number] DOB: 1932-01-10 DOA: 2015-10-31     PCP:  [facility]: Ct surgery Morton Peters Patient coming from:   home Lives  With family    Chief Complaint: generalized fatigue and cough confusion  HPI: Javier Gonzalez is a 82 y.o. male with medical history significant of DM,Postoperative A.fib, aortic root dilatation status post aortic valve replacement, HTN, HL, pulmonary hypertension, mild CAD doses of septal perforator of LAD    Presented with the past few days patient had had episodes of choking on water and coughing.  he has hardly eaten anything patient came to his  regular physician who was not sure if he may have had a pneumonia or not but ordered swallowing study for Monday unfortunate patient continued to deteriorate feeling more fatigued and became confused which point family brought him to emergency department. His symptoms have been associated shortness of breath has been progressive the past 1 week no associated chest pain no abdominal pain no nausea vomiting no diarrhea no neurological complaints no fever or chills. It was found out that patient had not been taking his medications he is reportedly being diabetic but does not take anything. Of note during his prior admission 2012 patient have had trouble with esophageal dysmotility. He demonstrated silent trace tracheal aspiration. Family states he has chronic problems with weakness and they sometimes have to cary him.  Regarding pertinent Chronic problems: In 2012 patient undergone Aortic valve replacement with a 23-mm biologic tissue valve, Edwards Magna Ease by Dr. Morton Peters, Sp cardiac cath at the same time showing only mild CAD.    IN ER:  Temp (24hrs), Avg:99.4 F (37.4 C), Min:99.3 F (37.4 C), Max:99.4 F (37.4 C)    Hypertensive after 162/114 heart rate up 112  Lactic acid 2.74 at 4 PM troponin 1.86 at 4:30 PM Na 147 K 3.7  Bicarb 21   Cr2.60  Alb 3.0  WBC 9.4 Hg9.9  plt  142  BNP 305  Following Medications were ordered in ER: Medications  0.9 %  sodium chloride infusion ( Intravenous New Bag/Given Oct 31, 2015 1735)  cefTRIAXone (ROCEPHIN) 1 g in dextrose 5 % 50 mL IVPB (1 g Intravenous New Bag/Given 2015-10-31 1835)  doxycycline (VIBRAMYCIN) 100 mg in dextrose 5 % 250 mL IVPB (not administered)  ipratropium-albuterol (DUONEB) 0.5-2.5 (3) MG/3ML nebulizer solution 3 mL (3 mLs Nebulization Given 10/31/2015 1707)  sodium chloride 0.9 % bolus 1,000 mL (1,000 mLs Intravenous New Bag/Given 2015/10/31 1835)  aspirin suppository 300 mg (300 mg Rectal Given Oct 31, 2015 1835)   Right pleural effusion bilateral atelectasis versus pneumonia, increased   ascending aorta  ER provider discussed case with:  Dr. nausea with cardiology who see patient in consult recommends aspirin Admission dictation service   Hospitalist was called for admission for aspiration pneumonia   Review of Systems:    Pertinent positives include: confusion  Constitutional:  No weight loss, night sweats, Fevers, chills, fatigue, weight loss  HEENT:  No headaches, Difficulty swallowing,Tooth/dental problems,Sore throat,  No sneezing, itching, ear ache, nasal congestion, post nasal drip,  Cardio-vascular:  No chest pain, Orthopnea, PND, anasarca, dizziness, palpitations.no Bilateral lower extremity swelling  GI:  No heartburn, indigestion, abdominal pain, nausea, vomiting, diarrhea, change in bowel habits, loss of appetite, melena, blood in stool, hematemesis Resp:  no shortness of breath at rest. No dyspnea on exertion, No excess mucus, no productive cough, No non-productive cough, No coughing up of blood.No change in color of mucus.No  wheezing. Skin:  no rash or lesions. No jaundice GU:  no dysuria, change in color of urine, no urgency or frequency. No straining to urinate.  No flank pain.  Musculoskeletal:  No joint pain or no joint swelling. No decreased range of motion. No back pain.  Psych:  No  change in mood or affect. No depression or anxiety. No memory loss.  Neuro: no localizing neurological complaints, no tingling, no weakness, no double vision, no gait abnormality, no slurred speech, no confusion  As per HPI otherwise 10 point review of systems negative.   Past Medical History: Past Medical History:  Diagnosis Date  . Aortic insufficiency   . Aortic root dilatation (HCC)   . Arthritis    Rt Knee  . HTN (hypertension)   . Hyperlipidemia   . S/P aortic valve replacement 11/18/2010   Zenaida Niece Tright: - for Severe AI. Edwards 23 mm Valve    Past Surgical History:  Procedure Laterality Date  . AORTIC VALVE REPLACEMENT  11/18/2010   23 mm [employer]  . CARDIAC CATHETERIZATION  10/2010   Dr. Anne Fu: right dominant system.   Only notable  lesion was 80% SP1.  Otherwise no significant disease.  . TRANSTHORACIC ECHOCARDIOGRAM  11/17/2010   ostop AVR: Mild LVH.  Mild to moderately reduced EF of 40-45%.   Stable aortic valve.  Small to moderate pericardial effusion.     Social History:  Ambulatory  independently or cane     reports that he has never smoked. He does not have any smokeless tobacco history on file. He reports that he drinks alcohol. He reports that he does not use drugs.  Allergies:  No Known Allergies     Family History:  Family History  Problem Relation Age of Onset  . Family history unknown: Yes    Medications: Prior to Admission medications   Medication Sig Start Date End Date Taking? Authorizing Provider  losartan-hydrochlorothiazide (HYZAAR) 100-12.5 MG per tablet Take 1 tablet by mouth daily.   Yes Historical Provider, MD  metoprolol (LOPRESSOR) 50 MG tablet Take 50 mg by mouth daily.    Yes Historical Provider, MD  metFORMIN (GLUCOPHAGE) 500 MG tablet Take 500 mg by mouth 2 (two) times daily with a meal.      Historical Provider, MD  simvastatin (ZOCOR) 40 MG tablet Take 40 mg by mouth at bedtime.      Historical Provider, MD     Physical Exam: Patient Vitals for the past 24 hrs:  BP Temp Temp src Pulse Resp SpO2 Height Weight  10/28/2015 1837 - 99.3 F (37.4 C) Rectal - - - - -  09/30/2015 1830 141/93 - - - 26 96 % - -  10/21/2015 1800 (!) 156/104 - - - 22 95 % - -  10/18/2015 1730 (!) 166/103 - - - (!) 27 95 % - -  10/03/2015 1707 - - - - - 95 % - -  10/25/2015 1702 169/81 - - 112 (!) 31 95 % - -  10/16/2015 1626 - - - - - 94 % - -  10/20/2015 1622 - - - 108 (!) 38 90 % - -  10/18/2015 1608 (!) 164/108 - - 105 23 93 % - -  10/27/2015 1604 - - - - - - 5\' 3"  (1.6 m) 68 kg (150 lb)  10/21/2015 1555 (!) 162/114 99.4 F (37.4 C) Rectal 106 20 97 % - -  10/02/2015 1551 - - - - - 91 % - -  1. General:  in No Acute distress 2. Psychological: Alert but not Oriented 3. Head/ENT:    Dry Mucous Membranes                          Head Non traumatic, neck supple                           Poor Dentition 4. SKIN:  decreased Skin turgor,  Skin clean Dry and intact no rash 5. Heart: Regular rate and rhythm  Murmur, Rub or gallop 6. Lungs:  no wheezes some  crackles   7. Abdomen: Soft,  non-tender, Non distended 8. Lower extremities: no clubbing, cyanosis, or edema 9. Neurologically Grossly intact, moving all 4 extremities equally  10. MSK: Normal range of motion   body mass index is 26.57 kg/m.  Labs on Admission:   Labs on Admission: I have personally reviewed following labs and imaging studies  CBC:  Recent Labs Lab 11-18-2015 1623  WBC 9.4  HGB 9.9*  HCT 30.6*  MCV 73.2*  PLT 142*   Basic Metabolic Panel:  Recent Labs Lab 11-18-2015 1623  NA 147*  K 3.7  CL 116*  CO2 21*  GLUCOSE 227*  BUN 92*  CREATININE 2.60*  CALCIUM 7.4*   GFR: Estimated Creatinine Clearance: 17.9 mL/min (by C-G formula based on SCr of 2.6 mg/dL). Liver Function Tests:  Recent Labs Lab 11-18-2015 1623  AST 32  ALT 31  ALKPHOS 59  BILITOT 1.8*  PROT 7.2  ALBUMIN 3.0*   No results for input(s): LIPASE, AMYLASE in the last 168  hours. No results for input(s): AMMONIA in the last 168 hours. Coagulation Profile: No results for input(s): INR, PROTIME in the last 168 hours. Cardiac Enzymes: No results for input(s): CKTOTAL, CKMB, CKMBINDEX, TROPONINI in the last 168 hours. BNP (last 3 results) No results for input(s): PROBNP in the last 8760 hours. HbA1C: No results for input(s): HGBA1C in the last 72 hours. CBG:  Recent Labs Lab 11-18-2015 1631  GLUCAP 200*   Lipid Profile: No results for input(s): CHOL, HDL, LDLCALC, TRIG, CHOLHDL, LDLDIRECT in the last 72 hours. Thyroid Function Tests: No results for input(s): TSH, T4TOTAL, FREET4, T3FREE, THYROIDAB in the last 72 hours. Anemia Panel: No results for input(s): VITAMINB12, FOLATE, FERRITIN, TIBC, IRON, RETICCTPCT in the last 72 hours. Urine analysis:    Component Value Date/Time   COLORURINE YELLOW 04-17-15 1620   APPEARANCEUR CLOUDY (A) 04-17-15 1620   LABSPEC 1.023 04-17-15 1620   PHURINE 5.0 04-17-15 1620   GLUCOSEU NEGATIVE 04-17-15 1620   HGBUR TRACE (A) 04-17-15 1620   BILIRUBINUR NEGATIVE 04-17-15 1620   KETONESUR NEGATIVE 04-17-15 1620   PROTEINUR 30 (A) 04-17-15 1620   UROBILINOGEN 0.2 11/16/2010 1511   NITRITE NEGATIVE 04-17-15 1620   LEUKOCYTESUR NEGATIVE 04-17-15 1620   Sepsis Labs: @LABRCNTIP (procalcitonin:4,lacticidven:4) ) Recent Results (from the past 240 hour(s))  Culture, blood (Routine x 2)     Status: None (Preliminary result)   Collection Time: 11-18-2015  4:19 PM  Result Value Ref Range Status   Specimen Description BLOOD LEFT ANTECUBITAL  Final   Special Requests IN PEDIATRIC BOTTLE 2CC  Final   Culture PENDING  Incomplete   Report Status PENDING  Incomplete     UA  no evidence of UTI  But has bacteria in urine  Lab Results  Component Value Date   HGBA1C 7.1 (H) 11/16/2010  Estimated Creatinine Clearance: 17.9 mL/min (by C-G formula based on SCr of 2.6 mg/dL).  BNP (last 3 results) No  results for input(s): PROBNP in the last 8760 hours.   ECG REPORT  Independently reviewed Rate: 106  Rhythm: Atrial tachycardia ST&T Change: LVH with secondary repolarization abnormality QTC  490  Filed Weights   2015-11-06 1604  Weight: 68 kg (150 lb)     Cultures:    Component Value Date/Time   SDES BLOOD LEFT ANTECUBITAL November 06, 2015 1619   SPECREQUEST IN PEDIATRIC BOTTLE 2CC 11-06-2015 1619   CULT PENDING 06-Nov-2015 1619   REPTSTATUS PENDING 2015-11-06 1619     Radiological Exams on Admission: Dg Chest 2 View  Result Date: 06-Nov-2015 CLINICAL DATA:  One week of shortness of breath and weakness ; history of hypertension, aortic insufficiency and aortic valve replacement. EXAM: CHEST  2 VIEW COMPARISON:  PA and lateral chest x-ray of September 04, 2011 FINDINGS: The lungs are hypoinflated greatest on the right. There is a moderate-sized right pleural effusion. Right basilar atelectasis or pneumonia is suspected. The cardiac silhouette is enlarged. The prosthetic mitral valve ring is visible. The sternal wires are intact. There is increase conspicuity of the ascending aorta. There is known aortic root dilation but no more recent chest x-rays are available for review than the July 2013 study. IMPRESSION: 1. New volume loss on the right. Right pleural effusion and probable basilar atelectasis or pneumonia. 2. Aortic atherosclerosis. Increased conspicuity of the ascending aorta. Given the history of aortic root dilation an aortic valve replacement, CT scanning of the chest is recommended to exclude aneurysmal dilation. 3. These results will be called to the ordering clinician or representative by the Radiologist Assistant, and communication documented in the PACS or zVision Dashboard. Electronically Signed   By: David  [place] M.D.   On: 11/06/15 16:55    Chart has been reviewed    Assessment/Plan  82 y.o. male with medical history significant of DM,Postoperative A.fib, aortic root  dilatation status post aortic valve replacement, HTN, HL, pulmonary hypertension, mild CAD doses of septal perforator of LAD Been admitted for acute renal failure secondary to dehydration and aspiration pneumonia Cardiology  is aware we'll consult  Present on Admission: . Sepsis (HCC) - Admit per Sepsis protocol likely source being aspiration pneumonia,   - rehydrate with 58ml/kg  - initiate antibiotics  Zosyn  -  obtain blood cultures  - Obtain serial lactic acid  - Obtain procalcitonin level  - Admit and monitor vital signs closely    Sepsis - Repeat Assessment  Performed at:    8:00PM  Vitals     Blood pressure 147/92, pulse (!) 52, temperature 99.3 F (37.4 C), temperature source Rectal, resp. rate (!) 29, height 5\' 3"  (1.6 m), weight 68 kg (150 lb), SpO2 94 %.  Heart:     Regular rate and rhythm  Lungs:    Rales  Capillary Refill:   <2 sec  Peripheral Pulse:   Radial pulse palpable  Skin:     Pale    . Aortic calcification (HCC) - as per cardiology, repeat echo . Essential hypertension - blood pressure remains labile we'll monitor overnight if needed would initiate beta blocker and Norvasc if heart rate allows . Demand ischemia of myocardium (HCC) monitor on telemetry cycle cardiac enzymes repeat echogram . Aspiration pneumonia (HCC) treat with Zosyn swallow evaluation in the morning . Lactic acidosis likely secondary to dehydration and sepsis rehydrate . Aortic root dilatation (HCC) repeat echogram, cardiology aware  consult in . Elevated troponin continue to cycle currently chest pain 3 if continues to rise we'll need to initiate heparin . Prolonged QT interval monitor on telemetry mild . Anemia obtain anemia panel Hemoccult stools Diabetes mellitus order sliding scale and hemoglobin A1c diabetic coordinator consult patient has not been compliant Acute encephalopathy likely secondary to dehydration unclear what the baseline is. Family has noticed some bruising around  the head will obtain CT of the head family states at baseline he is alert and oriented confusional been going on for the past 3 days.   Other plan as per orders.  DVT prophylaxis:  SCD    Code Status:  FULL CODE  as per family   Family Communication:   Family   at  Bedside  plan of care was discussed with  Son Mi Gareth Morgan Disposition Plan:     likely will need placement for rehabilitation but patient does not  not agree                                               Would benefit from PT/OT eval prior to DC  ordered                       Social Work,    Diabetes coordinator      consulted                          Consults called: Cardiology aware  Admission status:   inpatient       Level of care   Step down         I have spent a total of 56 min on this admission    Javier Gonzalez 10/11/2015, 9:05 PM    [facility]  Pager [phone]   after 2 AM please page floor coverage PA If 7AM-7PM, please contact the day team taking care of the patient  [email or web address]  Password [email or web address]

## 2015-10-22 NOTE — ED Notes (Signed)
[facility] and en route to administer breathing treatment.

## 2015-10-22 NOTE — ED Provider Notes (Signed)
  Code Blue CONSULT NOTE  Chief Complaint: Cardiac arrest/unresponsive   Level V Caveat: Unresponsive  History of present illness: I was contacted by the hospital for a CODE BLUE cardiac arrest upstairs and presented to the patient's bedside.    Code Blue called in ICU. On my arrival to pt's ICU room: CPR in progress. ICU RN states shortly after pt arrived to ICU, "shook his hands in the air and c/o SOB" then became unresponsive, apneic, pulseless. CPR started approximately 2130. Anesthesia at bedside and intubated pt. IV epi x3 given, without ROSC. Monitor remained PEA, no central pulses via doppler. [facility] Dr. Adela Glimpseoutova and I spoke with family regarding events that transpired, prognosis and termination of efforts; family verb understanding. Family requested to be by pt's side. CPR continued and #4 IV epi was given. On next pulse check, +audible bilateral femoral pulses by doppler. Monitor NSR, rate 70-80's. [facility] Dr. Adela Glimpseoutova to continue care of pt.    Physical Exam Gen: unresponsive Cardiovascular: pulseless  Resp: apneic. Breath sounds equal bilaterally with bagging  Abd: nondistended  Neuro: GCS 3, unresponsive to pain  HEENT: No blood in posterior pharynx, gag reflex absent  Neck: No crepitus  Musculoskeletal: No deformity  Skin: warm  Cardiopulmonary Resuscitation (CPR) Procedure Note Directed/Performed by: Laray AngerMCMANUS,Jessalynn Mccowan M I personally directed ancillary staff and/or performed CPR in an effort to regain return of spontaneous circulation and to maintain cardiac, neuro and systemic perfusion.       Samuel JesterKathleen Alleigh Mollica, DO 10/11/2015 2318

## 2015-10-22 NOTE — ED Notes (Signed)
Delay in lab draw due to antibiotics being administered

## 2015-10-22 NOTE — ED Provider Notes (Signed)
[facility]-EMERGENCY DEPT Provider Note   CSN: [id number] Arrival date & time: 10/06/2015  1545     History   Chief Complaint Chief Complaint  Patient presents with  . Failure To Thrive    HPI Javier Gonzalez is a 82 y.o. male.  HPI  Pt was seen at 1620.  Per pt and his family, c/o gradual onset and worsening of persistent SOB and cough for the past 1 week. Has been associated with poor PO intake as well as "choking on water." Pt was evaluated by his PMD yesterday for his symptoms, and is scheduled for a swallowing study on Monday (in 3 days). Pt's family states pt's PMD yesterday told them "he might have pneumonia." Denies objective home fevers, no CP/palpitations, no abd pain, no N/V/D, no back pain, no focal motor weakness.    Past Medical History:  Diagnosis Date  . Aortic insufficiency   . Aortic root dilatation (HCC)   . Arthritis    Rt Knee  . HTN (hypertension)   . Hyperlipidemia   . S/P aortic valve replacement 11/18/2010   Morton PetersVan Tright    Patient Active Problem List   Diagnosis Date Noted  . S/P aortic valve replacement 11/18/2010  . HTN (hypertension)   . Hyperlipidemia   . Aortic root dilatation (HCC)   . Aortic insufficiency     Past Surgical History:  Procedure Laterality Date  . CARDIAC VALVE REPLACEMENT  11/18/2010       Home Medications    Prior to Admission medications   Medication Sig Start Date End Date Taking? Authorizing Provider  aspirin 325 MG tablet Take 325 mg by mouth daily.    Historical Provider, MD  losartan-hydrochlorothiazide (HYZAAR) 100-12.5 MG per tablet Take 1 tablet by mouth daily.    Historical Provider, MD  metFORMIN (GLUCOPHAGE) 500 MG tablet Take 500 mg by mouth 2 (two) times daily with a meal.      Historical Provider, MD  metoprolol (LOPRESSOR) 50 MG tablet Take 50 mg by mouth 2 (two) times daily.      Historical Provider, MD  simvastatin (ZOCOR) 40 MG tablet Take 40 mg by mouth at bedtime.      Historical Provider, MD    Family  History Family History  Problem Relation Age of Onset  . Family history unknown: Yes    Social History Social History  Substance Use Topics  . Smoking status: Never Smoker  . Smokeless tobacco: Not on file  . Alcohol use Yes     Comment: rare     Allergies   Review of patient's allergies indicates no known allergies.   Review of Systems Review of Systems ROS: Statement: All systems negative except as marked or noted in the HPI; Constitutional: Negative for fever and chills. ; ; Eyes: Negative for eye pain, redness and discharge. ; ; ENMT: Negative for ear pain, hoarseness, nasal congestion, sinus pressure and sore throat. ; ; Cardiovascular: Negative for chest pain, palpitations, diaphoresis, and peripheral edema. ; ; Respiratory: +SOB, cough. Negative for wheezing and stridor. ; ; Gastrointestinal: +"choking on water," poor PO intake.  Negative for nausea, vomiting, diarrhea, abdominal pain, blood in stool, hematemesis, jaundice and rectal bleeding. . ; ; Genitourinary: Negative for dysuria, flank pain and hematuria. ; ; Musculoskeletal: Negative for back pain and neck pain. Negative for swelling and trauma.; ; Skin: Negative for pruritus, rash, abrasions, blisters, bruising and skin lesion.; ; Neuro: Negative for headache, lightheadedness and neck stiffness. Negative for weakness, altered level  of consciousness, altered mental status, extremity weakness, paresthesias, involuntary movement, seizure and syncope.      Physical Exam Updated Vital Signs BP (!) 164/108 (BP Location: Left Arm)   Pulse (S) 108   Temp 99.4 F (37.4 C) (Rectal)   Resp (S) (!) 38   Ht 5\' 3"  (1.6 m)   Wt 150 lb (68 kg)   SpO2 94%   BMI 26.57 kg/m    Patient Vitals for the past 24 hrs:  BP Temp Temp src Pulse Resp SpO2 Height Weight  10/02/2015 2115 (!) 174/105 - - (!) 50 14 96 % - -  10/02/2015 2045 (!) 160/101 - - (!) 49 26 96 % - -  10/14/2015 1945 147/92 - - (!) 52 (!) 29 94 % - -  10/17/2015 1919  129/93 - - 104 26 94 % - -  10/25/2015 1915 (!) 161/104 - - (!) 55 (!) 31 92 % - -  10/03/2015 1904 (!) 177/108 - - 108 26 94 % - -  10/15/2015 1837 - 99.3 F (37.4 C) Rectal - - - - -  10/25/2015 1830 141/93 - - - 26 96 % - -  09/29/2015 1800 (!) 156/104 - - - 22 95 % - -  09/29/2015 1730 (!) 166/103 - - - (!) 27 95 % - -  10/17/2015 1707 - - - - - 95 % - -  10/15/2015 1702 169/81 - - 112 (!) 31 95 % - -  10/21/2015 1626 - - - - - 94 % - -  10/20/2015 1622 - - - 108 (!) 38 90 % - -  10/25/2015 1608 (!) 164/108 - - 105 23 93 % - -  10/28/2015 1604 - - - - - - 5\' 3"  (1.6 m) 150 lb (68 kg)  10/05/2015 1555 (!) 162/114 99.4 F (37.4 C) Rectal 106 20 97 % - -  10/08/2015 1551 - - - - - 91 % - -     Physical Exam 1625: Physical examination:  Nursing notes reviewed; Vital signs and O2 SAT reviewed;  Constitutional: Well developed, Well nourished, In no acute distress. Appears fatigued.; Head:  Normocephalic, atraumatic; Eyes: EOMI, PERRL, No scleral icterus; ENMT: Mouth and pharynx normal, Mucous membranes dry; Neck: Supple, Full range of motion, No lymphadenopathy; Cardiovascular: Tacycardic rate and rhythm, No gallop; Respiratory: Breath sounds coarse & equal bilaterally, No wheezes. Normal respiratory effort/excursion; Chest: Nontender, Movement normal; Abdomen: Soft, Nontender, Nondistended, Normal bowel sounds; Genitourinary: No CVA tenderness; Extremities: Pulses normal, No tenderness, No edema, No calf edema or asymmetry.; Neuro: Awake, alert, oriented per baseline per family at bedside. No facial droop. Speech clear. Moves all extremities on stretcher spontaneously.; Skin: Color normal, Warm, Dry.    ED Treatments / Results  Labs (all labs ordered are listed, but only abnormal results are displayed)   EKG  EKG Interpretation  Date/Time:  Friday October 22 2015 16:03:57 EDT Ventricular Rate:  106 PR Interval:    QRS Duration: 92 QT Interval:  369 QTC Calculation: 490 R Axis:   55 Text Interpretation:   Ectopic atrial tachycardia, unifocal LVH with secondary repolarization abnormality Borderline prolonged QT interval Baseline wander in lead(s) V6 Similar to prior EKG  Confirmed by LIU MD, Annabelle Harman ([id number]) on 10/01/2015 4:05:56 PM       Radiology   Procedures Procedures (including critical care time)  Medications Ordered in ED Medications  0.9 %  sodium chloride infusion (not administered)     Initial Impression / Assessment and  Plan / ED Course  I have reviewed the triage vital signs and the nursing notes.  Pertinent labs & imaging results that were available during my care of the patient were reviewed by me and considered in my medical decision making (see chart for details).  MDM Reviewed: previous chart, nursing note and vitals Reviewed previous: labs and ECG Interpretation: labs, ECG and x-ray   Results for orders placed or performed during the hospital encounter of 10/26/2015  Comprehensive metabolic panel  Result Value Ref Range   Sodium 147 (H) 135 - 145 mmol/L   Potassium 3.7 3.5 - 5.1 mmol/L   Chloride 116 (H) 101 - 111 mmol/L   CO2 21 (L) 22 - 32 mmol/L   Glucose, Bld 227 (H) 65 - 99 mg/dL   BUN 92 (H) 6 - 20 mg/dL   Creatinine, Ser 1.61 (H) 0.61 - 1.24 mg/dL   Calcium 7.4 (L) 8.9 - 10.3 mg/dL   Total Protein 7.2 6.5 - 8.1 g/dL   Albumin 3.0 (L) 3.5 - 5.0 g/dL   AST 32 15 - 41 U/L   ALT 31 17 - 63 U/L   Alkaline Phosphatase 59 38 - 126 U/L   Total Bilirubin 1.8 (H) 0.3 - 1.2 mg/dL   GFR calc non Af Amer 22 (L) >60 mL/min   GFR calc Af Amer 25 (L) >60 mL/min   Anion gap 10 5 - 15  CBC  Result Value Ref Range   WBC 9.4 4.0 - 10.5 K/uL   RBC 4.18 (L) 4.22 - 5.81 MIL/uL   Hemoglobin 9.9 (L) 13.0 - 17.0 g/dL   HCT 09.6 (L) 04.5 - 40.9 %   MCV 73.2 (L) 78.0 - 100.0 fL   MCH 23.7 (L) 26.0 - 34.0 pg   MCHC 32.4 30.0 - 36.0 g/dL   RDW 81.1 (H) 91.4 - 78.2 %   Platelets 142 (L) 150 - 400 K/uL  Urinalysis, Routine w reflex microscopic  Result Value Ref Range    Color, Urine YELLOW YELLOW   APPearance CLOUDY (A) CLEAR   Specific Gravity, Urine 1.023 1.005 - 1.030   pH 5.0 5.0 - 8.0   Glucose, UA NEGATIVE NEGATIVE mg/dL   Hgb urine dipstick TRACE (A) NEGATIVE   Bilirubin Urine NEGATIVE NEGATIVE   Ketones, ur NEGATIVE NEGATIVE mg/dL   Protein, ur 30 (A) NEGATIVE mg/dL   Nitrite NEGATIVE NEGATIVE   Leukocytes, UA NEGATIVE NEGATIVE  Brain natriuretic peptide  Result Value Ref Range   B Natriuretic Peptide 305.5 (H) 0.0 - 100.0 pg/mL  Urine microscopic-add on  Result Value Ref Range   Squamous Epithelial / LPF 0-5 (A) NONE SEEN   WBC, UA 0-5 0 - 5 WBC/hpf   RBC / HPF 0-5 0 - 5 RBC/hpf   Bacteria, UA MANY (A) NONE SEEN   Casts HYALINE CASTS (A) NEGATIVE  CBG monitoring, ED  Result Value Ref Range   Glucose-Capillary 200 (H) 65 - 99 mg/dL  I-Stat CG4 Lactic Acid, ED  Result Value Ref Range   Lactic Acid, Venous 2.74 (HH) 0.5 - 1.9 mmol/L   Comment NOTIFIED PHYSICIAN   I-stat troponin, ED  Result Value Ref Range   Troponin i, poc 1.86 (HH) 0.00 - 0.08 ng/mL   Comment NOTIFIED PHYSICIAN    Comment 3           Dg Chest 2 View Result Date: 10/05/2015 CLINICAL DATA:  One week of shortness of breath and weakness ; history of hypertension, aortic insufficiency and  aortic valve replacement. EXAM: CHEST  2 VIEW COMPARISON:  PA and lateral chest x-ray of September 04, 2011 FINDINGS: The lungs are hypoinflated greatest on the right. There is a moderate-sized right pleural effusion. Right basilar atelectasis or pneumonia is suspected. The cardiac silhouette is enlarged. The prosthetic mitral valve ring is visible. The sternal wires are intact. There is increase conspicuity of the ascending aorta. There is known aortic root dilation but no more recent chest x-rays are available for review than the July 2013 study. IMPRESSION: 1. New volume loss on the right. Right pleural effusion and probable basilar atelectasis or pneumonia. 2. Aortic atherosclerosis. Increased  conspicuity of the ascending aorta. Given the history of aortic root dilation an aortic valve replacement, CT scanning of the chest is recommended to exclude aneurysmal dilation. 3. These results will be called to the ordering clinician or representative by the Radiologist Assistant, and communication documented in the PACS or zVision Dashboard. Electronically Signed   By: David  [place] M.D.   On: 10/25/2015 16:55   Results for LISA, BLAKEMAN (MRN [id number]) as of 10/21/2015 17:48  Ref. Range 11/21/2010 04:10 11/22/2010 05:42 11/24/2010 06:00 11/25/2010 05:40 11/26/2010 05:00 10/11/2015 16:23  BUN Latest Ref Range: 6 - 20 mg/dL 20 22 29  (H) 21 20 92 (H)  Creatinine Latest Ref Range: 0.61 - 1.24 mg/dL 4.09 8.11 9.14 7.82 9.56 2.60 (H)    1805:  H/H and platelets near baseline. Family does not believe pt has been taking any of his medications, including ASA, for "years," most likely dating back to his AVR in 2012. Will dose ASA now. IV abx started for CAP after BC and UC obtained. Short neb given for initial Sats 88-90% R/A. Sats now increased to 96% on O2 2L N/C. T/C to Cards Dr. Elease Hashimoto, case discussed, including:  HPI, pertinent PM/SHx, VS/PE, dx testing, ED course and treatment:  Agrees with ASA, IVF, IV abx, will consult, requests to admit to medicine service.  T/C to [facility] Dr. Adela Glimpse, case discussed, including:  HPI, pertinent PM/SHx, VS/PE, dx testing, ED course and treatment:  Agreeable to admit, requests she will come to the ED for evaluation.       Final Clinical Impressions(s) / ED Diagnoses   Final diagnoses:  None    New Prescriptions New Prescriptions   No medications on file     Samuel Jester, DO 11/03/15 2335

## 2015-10-22 NOTE — Progress Notes (Signed)
Code Blue initiated on pt., RT on floor heard audible alert, called to notify other RT in Hospital while en-route to pts. room, upon arrival to room pt. was already being bagged with AMBU by RN with other staff @ bedside, assured oxygen @15  lpm, suction already s/u and on @ bedside, airway box pulled out of already opened code cart with 7.5 E.T. Tube, EtC02 cap, MAC 3 blade w/handle pulled then CRNA @ bedside, RT took over bagging pt. while awaiting intubation=(+) placement confirmed by CRNA  with EtC02 cap device placed, b/l b.s.,=chest rise and (-) stomach sounds, 2nd RT placed ZOLL EtC02 inline showing (+) reading initially while pt. perfusing, continued monitoring pt. per protocol until CODE called, no complications.

## 2015-10-22 NOTE — Consult Note (Signed)
Cardiology Consult    Patient ID: Javier Gonzalez MRN: [id number], DOB/AGE: [birth year]   Admit date: 11-18-2015 Date of Consult: 11/18/15  Primary Physician: PROVIDER NOT IN SYSTEM Primary Cardiologist: Anne Fu (not seen since 2012) Requesting Provider: Dr. Clarene Duke (EDP) - For Dr. Adela Glimpse [facility])   Patient Profile    82 year old gentleman who is most likely from  [place]  [place] with a history of Aortic insufficiency status post aVR in September 2012 by Dr. Donata Clay.   He was initially evaluated by Dr. Donato Schultz for abnormal stress test as part of preoperative evaluation for knee surgery.  Echocardiogram showed moderate to severe bout severe aortic insufficiency.  He therefore underwent cardiac catheterization as part of his preoperative evaluation for AVR and found minimal CAD with only an 80% septal perforator lesion. He never did see Dr. Anne Fu again for follow-up, and only intermittently takes his and apprehensive agents  As far as I can tell, he has been lost to follow-up since that operation.  No further data is available on the chart since symptom 2012.  He is not at [facility] since his postop echo showing EF of 40-45%.He also has a dilated aortic root  That was described at the time of his aortic valve surgery.  He apparently is in the emergency room for what appears to be possible aspiration pneumonia.  We are called because of positive troponins  In the setting of lactic acidosis, and likely acute renal insufficiency.    Unfortunately, the patient is a poor historian at baseline with clearly some baseline dementia but deftly altered mental state.  His history is provided by his son.  Past Medical History   Past Medical History:  Diagnosis Date  . Aortic insufficiency   . Aortic root dilatation (HCC)   . Arthritis    Rt Knee  . HTN (hypertension)   . Hyperlipidemia   . S/P aortic valve replacement 11/18/2010   Zenaida Niece Tright: - for Severe AI. Edwards 23 mm Valve     Past  Surgical History:  Procedure Laterality Date  . AORTIC VALVE REPLACEMENT  11/18/2010   23 mm [employer]  . CARDIAC CATHETERIZATION  10/2010   Dr. Anne Fu: right dominant system.   Only notable  lesion was 80% SP1.  Otherwise no significant disease.  . TRANSTHORACIC ECHOCARDIOGRAM  11/17/2010   ostop AVR: Mild LVH.  Mild to moderately reduced EF of 40-45%.   Stable aortic valve.  Small to moderate pericardial effusion.     Allergies  No Known Allergies  History of Present Illness    Javier Gonzalez was brought to [facility] ER After he initially went to his primary care physician yesterday with several days of altered  Medical state.  He has been coughing and becoming more weak over the last week or so.  The son indicates that he may have had some choking episodes, but has become increasingly more lethargic and less responsive.  At baseline he walks around the house and will a bit, and may communicate some, but does not really do much more than that.  As far as I can tell there has not been any complaints of any discomfort other in the chest or stomach, but he has had some discomfort in his back with coughing.  No clear signs of fevers or chills, but he states it is difficult to tell.  He went to his PCP yesterday, and there was thought that there is potential aspiration. The plan was to  check a swallow study on Monday  There was no suggestion of chest tightness or pressure, palpitations.  No syncope or near syncope. Could not tell at there was an acute  Change in mental status, this is been more progressive over the last several days.  In the ER, he has received  IV fluids and is now receiving antibiotics as the chest x-ray suggested possible pneumonia.  Inpatient Medications       Prior to Admission medications   Medication Sig Start Date End Date Taking? Authorizing Provider  losartan-hydrochlorothiazide (HYZAAR) 100-12.5 MG per tablet Take 1 tablet by mouth daily.   Yes Historical Provider,  MD  metoprolol (LOPRESSOR) 50 MG tablet Take 50 mg by mouth daily.    Yes Historical Provider, MD  metFORMIN (GLUCOPHAGE) 500 MG tablet Take 500 mg by mouth 2 (two) times daily with a meal.      Historical Provider, MD  simvastatin (ZOCOR) 40 MG tablet Take 40 mg by mouth at bedtime.      Historical Provider, MD     Family History    Family History  Problem Relation Age of Onset  . Family history unknown: Yes  - Not contributory, the son is not aware of any family history of the patient's parents or siblings.  Social History    Social History   Social History  . Marital status: Married    Spouse name: N/A  . Number of children: N/A  . Years of education: N/A   Occupational History  . retired    Social History Main Topics  . Smoking status: Never Smoker  . Smokeless tobacco: Not on file  . Alcohol use Yes     Comment: rare  . Drug use: No  . Sexual activity: Not on file   Other Topics Concern  . Not on file   Social History Narrative  . No narrative on file     Review of Systems    Provided by  The patient's son Review of Systems  Unable to perform ROS: Dementia  Constitutional: Positive for malaise/fatigue and weight loss.       Could not tell about subjective fevers and chills.  Respiratory: Positive for cough, shortness of breath and wheezing.   Cardiovascular: Negative for chest pain, palpitations and leg swelling.  Gastrointestinal: Positive for nausea. Negative for vomiting.       Decreased by mouth intake over the last several days. Having difficulty swallowing.  Also pain with swallowing  Genitourinary: Negative for frequency.  Neurological: Positive for weakness. Negative for dizziness.  - Language barrier, and only Minimal responsiveness Along with acute medical illness  Physical Exam    Blood pressure 141/93, pulse 112, temperature 99.3 F (37.4 C), temperature source Rectal, resp. rate 26, height 5\' 3"  (1.6 m), weight 150 lb (68 kg), SpO2 96 %.    General appearance: he is awake, and responds to verbal cues with his son.  Does follow some commands, but not fully.  He is frail, chronically ill-appearing but now acutely ill-appearing gentleman who looks borderline toxic.  He is lying in near fetal position on his left side. Neck: no adenopathy, no carotid bruit and no JVD Lungs: diffuse coarse /rhonchorousbreath sounds throughout.  Mostly decreased on the right base, but is lying on his side and not good rest preoperative.  No accessory muscle use, but tachypneic Heart: ddifficult to hear due to breath sounds, but very tachycardic with normal S1 and S2.  There is a 1-2/6 SEM at  RUSB. cannot determine if there is any R/G Abdomen: ssomewhat scaphoid, nontender, nondistended.  Decreased bowel sounds Extremities: extremities normal, atraumatic, no cyanosis or edema and no ulcers, gangrene or trophic changes Pulses: weak, thready pulses are palpable bilaterally  in upper and lower remedies. Skin: normal color, thin frail skin appears to have tenting Neurologic: Mental status: alertness: bbut responsive, follows some commands.  Cranial nerves are difficult to assess, but he did follow a light  and smile.  Moved all four extremities   Labs    Troponin (Point of Care Test)  Recent Labs  10/08/2015 1629  TROPIPOC 1.86*   No results for input(s): CKTOTAL, CKMB, TROPONINI in the last 72 hours. Lab Results  Component Value Date   WBC 9.4 10/26/2015   HGB 9.9 (L) 10/19/2015   HCT 30.6 (L) 09/29/2015   MCV 73.2 (L) 10/02/2015   PLT 142 (L) 10/13/2015     Recent Labs Lab 10/06/2015 1623  NA 147*  K 3.7  CL 116*  CO2 21*  BUN 92*  CREATININE 2.60*  CALCIUM 7.4*  PROT 7.2  BILITOT 1.8*  ALKPHOS 59  ALT 31  AST 32  GLUCOSE 227*   No results found for: CHOL, HDL, LDLCALC, TRIG No results found for: [facility]   Radiology Studies    Dg Chest 2 View  Result Date: 10/03/2015 CLINICAL DATA:  One week of shortness of breath and weakness  ; history of hypertension, aortic insufficiency and aortic valve replacement. EXAM: CHEST  2 VIEW COMPARISON:  PA and lateral chest x-ray of September 04, 2011 FINDINGS: The lungs are hypoinflated greatest on the right. There is a moderate-sized right pleural effusion. Right basilar atelectasis or pneumonia is suspected. The cardiac silhouette is enlarged. The prosthetic mitral valve ring is visible. The sternal wires are intact. There is increase conspicuity of the ascending aorta. There is known aortic root dilation but no more recent chest x-rays are available for review than the July 2013 study. IMPRESSION: 1. New volume loss on the right. Right pleural effusion and probable basilar atelectasis or pneumonia. 2. Aortic atherosclerosis. Increased conspicuity of the ascending aorta. Given the history of aortic root dilation an aortic valve replacement, CT scanning of the chest is recommended to exclude aneurysmal dilation. 3. These results will be called to the ordering clinician or representative by the Radiologist Assistant, and communication documented in the PACS or zVision Dashboard. Electronically Signed   By: Kinston Magnan  [place] M.D.   On: 10/16/2015 16:55    ECG & Cardiac Imaging    1603: Sinus tachycardia Rate 106.with significant LVH noted in corneal leads. -When compared to the similar EKG in 2012, it appears to be sinus tachycardia 90 to fibrillation otherwise no change.  Assessment & Plan    Principal Problem:   Aspiration pneumonia (HCC) Active Problems:   Demand ischemia of myocardium (HCC)   Lactic acidosis   Acute renal insufficiency   Essential hypertension   Aortic calcification (HCC)   Acutely ill gentleman with renal insufficiency and lactic acidosis Likely from a noncardiac source who now has mild elevationIn troponin levels that is probably consistent with demand ischemia/infarction (type II MI). Chest x-ray appears to have what looks like a pneumoniaof uncertain etiology.He is to  Make an tachycardic in sinus tachycardia.  Mild BNP elevation may simply be related to tachycardia, Unlikely to be true heart failure as he has an elevated BUN and creatinine suggesting dehydration.   - Recommend Internal medicine/potentially even critical care admission  At  this point, with  His renal insufficiency and ongoing inflammation/infection issues, we would not consider taking this patient acutely to the cardiac catheterization lab. He had minimal CAD five years ago.  I would not recommend IV heparin unless his troponin levels were to increase.  Elevated troponin levels in this setting are simply a negative prognostic indicator indicating end organ damage.  Its shoulder levels continue to climb and a more ACS--type manner, would potentially consider IV heparin for 48 hours.   He has not had an echocardiogram in five years, would not be unreasonable check an echocardiogram to assess his overall ejection fraction and his prosthetic valve.  Need to get a clear assessment of his hypertension, he was significant only hypertensive on the monitor when I saw him, but is initial readings were all grossly normal.   If he is indeed hypertensive, would probably address with a beta blocker or amlodipine  Would consider dedicated CT angiogram of the aorta to evaluate the thoracic aorta In its entirety -> Would not do a contrasted CT at this point with his renal insufficiency.   We will follow the trend of his troponins and await the results of his echocardiogram.  We will follow at a distance and divided assistance as necessary if issues arise.    Signed, Bryan Lemma, M.D., M.S. Interventional Cardiologist   Pager # [phone] Phone # [phone] [address] [place], [place] [address]  10/12/2015, 6:43 PM

## 2015-10-22 NOTE — ED Notes (Signed)
Bed: WA05 Expected date:  Expected time:  Means of arrival:  Comments: EMS-altered 

## 2015-10-22 NOTE — Addendum Note (Signed)
Addendum  created 01/10/16 2308 by Epimenio SarinJoshua R Levii Hairfield, CRNA   Anesthesia Event edited, Anesthesia Intra Blocks edited, Anesthesia Staff edited, LDA updated via procedure documentation, Sign clinical note

## 2015-10-22 NOTE — ED Notes (Signed)
Cardiology at bedside.

## 2015-10-22 NOTE — Addendum Note (Signed)
Addendum  created 10/24/2015 2319 by Epimenio SarinJoshua R Celestial Barnfield, CRNA   Charge Capture section accepted, Visit diagnoses modified

## 2015-10-22 NOTE — ED Triage Notes (Addendum)
Per EMS family request transport for altered mental status. Pt seen had primary care provider yesterday for same told "do not know if it is pneumonia or what." Swallow study scheduled Monday related to "choking on water." Pt non compliant with medications.

## 2015-10-22 NOTE — ED Notes (Signed)
MD made aware of I-Stat values

## 2015-10-22 NOTE — Progress Notes (Signed)
Pharmacy Antibiotic Note  Javier Gonzalez is a 82 y.o. male admitted on 10/12/2015 with aspiration pneumonia.  Patient with significant PMH including s/p AVR, HTN, pulm HTN, CAD, DM, post-op AFib.  Pharmacy has been consulted for Zosyn dosing.  Plan: Zosyn 2.25gm IV q6h Follow renal function Follow cultures/sensitivies  Height: 5\' 3"  (160 cm) Weight: 150 lb (68 kg) IBW/kg (Calculated) : 56.9  Temp (24hrs), Avg:99.4 F (37.4 C), Min:99.3 F (37.4 C), Max:99.4 F (37.4 C)   Recent Labs Lab 10/08/2015 1623 10/26/2015 1631 10/03/2015 2018  WBC 9.4  --   --   CREATININE 2.60*  --   --   LATICACIDVEN  --  2.74* 2.03*    Estimated Creatinine Clearance: 17.9 mL/min (by C-G formula based on SCr of 2.6 mg/dL).    No Known Allergies  Antimicrobials this admission: 8/25 Doxycycline x 1 8/25 Ceftriaxone x 1  8/25 Zosyn >>  Dose adjustments this admission:    Microbiology results: 8/25 BCx: sent 8/25 UCx: sent  8/25 Sputum: sent   Thank you for allowing pharmacy to be a part of this patient's care.  Maryellen PilePoindexter, Rekisha Welling Trefz, PharmD 10/13/2015 11:12 PM

## 2015-10-22 NOTE — ED Notes (Signed)
Instructed to call [phone] for report when pt is ready

## 2015-10-22 NOTE — ED Notes (Signed)
EKG given to EDP,Lui,MD., for review. 

## 2015-10-22 NOTE — ED Triage Notes (Signed)
With triage son reports called EMS related to pt continued not eating or drinking AND coughing. Son reports pt orientation per normal. Pt alert to self, place, situation, but NOT time. Son states "he does not normally know the day unless he asks."

## 2015-10-22 NOTE — Anesthesia Procedure Notes (Addendum)
Procedure Name: Intubation Date/Time: 2015-12-14 10:35 PM Performed by: Epimenio SarinJARVELA, Javier Gonzalez Pre-anesthesia Checklist: Patient identified, Emergency Drugs available, Suction available, Patient being monitored and Timeout performed Patient Re-evaluated:Patient Re-evaluated prior to inductionOxygen Delivery Method: Ambu bag Preoxygenation: Pre-oxygenation with 100% oxygen Ventilation: Mask ventilation without difficulty Laryngoscope Size: Mac and 3 Grade View: Grade II Tube type: Oral Tube size: 7.5 mm Number of attempts: 1 Airway Equipment and Method: Stylet Placement Confirmation: ETT inserted through vocal cords under direct vision,  breath sounds checked- equal and bilateral and CO2 detector Secured at: 23 cm Tube secured with: Tape Dental Injury: Teeth and Oropharynx as per pre-operative assessment

## 2015-10-22 NOTE — Discharge Summary (Signed)
Physician Discharge Summary  Javier Gonzalez WUJ:[id number] DOB: Dec 19, 1931 DOA: 10/21/2015  PCP: PROVIDER NOT IN SYSTEM  Admit date: 10/14/2015 Discharge date: 10/26/2015  Admitted From: home Disposition:  expired    Discharge Condition:expired    Brief/Interim Summary: patient was admitted for sepsis and aspiration pneumonia please see H/P for details shortly after arrival to the floor  Code blue was activated 22:30. Patient received 4 round of EPI remained in PEA arrest. Pulse returned briefly 22:52 but than was lost again.  Patient was in PEA arrest code blue reactivated as per family request at 23:07. Patient was given 1 dose of Epi and 1 dose of bicarb Patient had brief episode of V.fib 1 shock was administered. PEA arrest resumed. Pulses were never recovered. Family at bedside. After discussion Code was called at 23:10.  Family does not wish for Autopsy to be performed.   Electrical activity stopped at 23:35 pm patient has expired You may copy/paste interim summary or write brief hospital course depending on length of stay  Discharge Diagnoses:  Principal Problem:   Aspiration pneumonia (HCC) Active Problems:   Essential hypertension   Aortic root dilatation (HCC)   S/P aortic valve replacement   Aortic calcification (HCC)   Demand ischemia of myocardium (HCC)   Lactic acidosis   Acute renal insufficiency   DM2 (diabetes mellitus, type 2) (HCC)   Elevated troponin   Prolonged QT interval   Anemia   Sepsis (HCC)   Chronic systolic (congestive) heart failure (HCC)   Acute encephalopathy    No Known Allergies  Consultations: PCCM Procedures/Studies: Dg Chest 2 View  Result Date: 10/06/2015 CLINICAL DATA:  One week of shortness of breath and weakness ; history of hypertension, aortic insufficiency and aortic valve replacement. EXAM: CHEST  2 VIEW COMPARISON:  PA and lateral chest x-ray of September 04, 2011 FINDINGS: The lungs are hypoinflated greatest on the right. There is a  moderate-sized right pleural effusion. Right basilar atelectasis or pneumonia is suspected. The cardiac silhouette is enlarged. The prosthetic mitral valve ring is visible. The sternal wires are intact. There is increase conspicuity of the ascending aorta. There is known aortic root dilation but no more recent chest x-rays are available for review than the July 2013 study. IMPRESSION: 1. New volume loss on the right. Right pleural effusion and probable basilar atelectasis or pneumonia. 2. Aortic atherosclerosis. Increased conspicuity of the ascending aorta. Given the history of aortic root dilation an aortic valve replacement, CT scanning of the chest is recommended to exclude aneurysmal dilation. 3. These results will be called to the ordering clinician or representative by the Radiologist Assistant, and communication documented in the PACS or zVision Dashboard. Electronically Signed   By: David  [place] M.D.   On: 10/11/2015 16:55   Ct Head Wo Contrast  Result Date: 10/05/2015 CLINICAL DATA:  Adhesion. Patient is not eating or drinking and is coughing. EXAM: CT HEAD WITHOUT CONTRAST TECHNIQUE: Contiguous axial images were obtained from the base of the skull through the vertex without intravenous contrast. COMPARISON:  None. FINDINGS: Brain: Diffuse cerebral atrophy. Ventricular dilatation consistent with central atrophy. Low-attenuation changes in the deep white matter consistent with small vessel ischemia. 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 No mass effect or midline shift. No abnormal extra-axial fluid collections. Gray-white matter junctions are distinct. Basal cisterns are not effaced. No evidence of acute intracranial hemorrhage. Vascular: Vascular calcifications.  Tortuous basilar artery. Skull: No acute depressed skull fractures demonstrated. Sinuses/Orbits: Mild mucosal thickening  in the paranasal sinuses. Mastoid air cells are not opacified. Other: None. IMPRESSION: No acute  intracranial abnormalities. Chronic atrophy and small vessel ischemic changes. 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 Electronically Signed   By: Burman NievesWilliam  Stevens M.D.   On: Jul 01, 2015 21:32    (Echo, Carotid, EGD, Colonoscopy, ERCP)    Subjective:   Discharge Exam: Vitals:   2015/03/22 2045 2015/03/22 2115  BP: (!) 160/101 (!) 174/105  Pulse: (!) 49 (!) 50  Resp: 26 14  Temp:     Vitals:   2015/03/22 1919 2015/03/22 1945 2015/03/22 2045 2015/03/22 2115  BP: 129/93 147/92 (!) 160/101 (!) 174/105  Pulse: 104 (!) 52 (!) 49 (!) 50  Resp: 26 (!) 29 26 14   Temp:      TempSrc:      SpO2: 94% 94% 96% 96%  Weight:      Height:        General: Pt is alert, awake, not in acute distress Cardiovascular: RRR, S1/S2 +, no rubs, no gallops Respiratory: CTA bilaterally, no wheezing, no rhonchi Abdominal: Soft, NT, ND, bowel sounds + Extremities: no edema, no cyanosis    The results of significant diagnostics from this hospitalization (including imaging, microbiology, ancillary and laboratory) are listed below for reference.     Microbiology: Recent Results (from the past 240 hour(s))  Culture, blood (Routine x 2)     Status: None (Preliminary result)   Collection Time: 2015/03/22  4:19 PM  Result Value Ref Range Status   Specimen Description BLOOD LEFT ANTECUBITAL  Final   Special Requests IN PEDIATRIC BOTTLE 2CC  Final   Culture PENDING  Incomplete   Report Status PENDING  Incomplete     Labs: BNP (last 3 results)  Recent Labs  2015/03/22 1623  BNP 305.5*   Basic Metabolic Panel:  Recent Labs Lab 2015/03/22 1623  NA 147*  K 3.7  CL 116*  CO2 21*  GLUCOSE 227*  BUN 92*  CREATININE 2.60*  CALCIUM 7.4*   Liver Function Tests:  Recent Labs Lab 2015/03/22 1623  AST 32  ALT 31  ALKPHOS 59  BILITOT 1.8*  PROT 7.2  ALBUMIN 3.0*   No results for input(s): LIPASE, AMYLASE in the last 168 hours. No results for input(s): AMMONIA in the last 168  hours. CBC:  Recent Labs Lab 2015/03/22 1623  WBC 9.4  HGB 9.9*  HCT 30.6*  MCV 73.2*  PLT 142*   Cardiac Enzymes: No results for input(s): CKTOTAL, CKMB, CKMBINDEX, TROPONINI in the last 168 hours. BNP: Invalid input(s): POCBNP CBG:  Recent Labs Lab 2015/03/22 1631 2015/03/22 2254  GLUCAP 200* 247*   D-Dimer No results for input(s): DDIMER in the last 72 hours. Hgb A1c No results for input(s): HGBA1C in the last 72 hours. Lipid Profile No results for input(s): CHOL, HDL, LDLCALC, TRIG, CHOLHDL, LDLDIRECT in the last 72 hours. Thyroid function studies No results for input(s): TSH, T4TOTAL, T3FREE, THYROIDAB in the last 72 hours.  Invalid input(s): FREET3 Anemia work up No results for input(s): VITAMINB12, FOLATE, FERRITIN, TIBC, IRON, RETICCTPCT in the last 72 hours. Urinalysis    Component Value Date/Time   COLORURINE YELLOW Jul 01, 2015 1620   APPEARANCEUR CLOUDY (A) Jul 01, 2015 1620   LABSPEC 1.023 Jul 01, 2015 1620   PHURINE 5.0 Jul 01, 2015 1620   GLUCOSEU NEGATIVE Jul 01, 2015 1620   HGBUR TRACE (A) Jul 01, 2015 1620   BILIRUBINUR NEGATIVE Jul 01, 2015 1620   KETONESUR NEGATIVE Jul 01, 2015 1620   PROTEINUR 30 (A) Jul 01, 2015 1620   UROBILINOGEN  0.2 11/16/2010 1511   NITRITE NEGATIVE 11-21-2015 1620   LEUKOCYTESUR NEGATIVE 2015/11/21 1620   Sepsis Labs Invalid input(s): PROCALCITONIN,  WBC,  LACTICIDVEN Microbiology Recent Results (from the past 240 hour(s))  Culture, blood (Routine x 2)     Status: None (Preliminary result)   Collection Time: November 21, 2015  4:19 PM  Result Value Ref Range Status   Specimen Description BLOOD LEFT ANTECUBITAL  Final   Special Requests IN PEDIATRIC BOTTLE 2CC  Final   Culture PENDING  Incomplete   Report Status PENDING  Incomplete     Time coordinating discharge: Over 30 minutes over 1 hour spend at bedside  SIGNED:   Therisa Doyne, MD  [facility] November 21, 2015, 11:38 PM Pager   If 7PM-7AM, please contact  night-coverage [email or web address] Password [email or web address]

## 2015-10-23 ENCOUNTER — Other Ambulatory Visit (HOSPITAL_COMMUNITY): Payer: Medicare Other

## 2015-10-23 DIAGNOSIS — J90 Pleural effusion, not elsewhere classified: Secondary | ICD-10-CM

## 2015-10-23 DIAGNOSIS — J189 Pneumonia, unspecified organism: Secondary | ICD-10-CM

## 2015-10-23 LAB — URINE CULTURE: Culture: NO GROWTH

## 2015-10-23 LAB — STREP PNEUMONIAE URINARY ANTIGEN: Strep Pneumo Urinary Antigen: NEGATIVE

## 2015-10-23 MED FILL — Medication: Qty: 1 | Status: AC

## 2015-10-25 ENCOUNTER — Ambulatory Visit (HOSPITAL_COMMUNITY): Payer: Medicare Other

## 2015-10-25 ENCOUNTER — Other Ambulatory Visit (HOSPITAL_COMMUNITY): Payer: Medicare Other

## 2015-10-27 LAB — CULTURE, BLOOD (ROUTINE X 2)
CULTURE: NO GROWTH
Culture: NO GROWTH

## 2015-10-29 NOTE — Progress Notes (Signed)
Chaplain was paged to provide care to another patient in the ICU and took the opportunity to look in on the family and the Buddhist monks attending to the family. The Buddhist rituals have come to an end and all were waiting based in their religious tradition for the funeral home attendants to come for the body. The family greatly appreciates the staff's gracious sensitivity to their traditions and their religious beliefs.  Page the chaplain should the family or Buddhist monks require or desire chaplain assistance.  Javier Gonzalez, Javier Gonzalez, MDiv, Javier Gonzalez Night and Weekend [address]ha[address]

## 2015-10-29 NOTE — Progress Notes (Signed)
Chaplain paged at 2240 alerted to the [place]. I arrived at or about 2249 and went in to the room to stand with family members as CPR was still being performed. After three attempts by the team to start Javier Gonzalez's heart, family gave the team permission to cease heroic measures and Javier Gonzalez dead. At that point family members in the area were called and Buddhist monks were called to come to provide Buddhist rites. I stood with a family member at the ED doors to greet the members of the family and the Buddhist monks. Security officers graciously guided groups of family members to the room. When the Buddhist monks arrived I escorted them to the room and ensured their needs were met. Nurses were asked how long the rites could be held and were given permission to stay as long as needed. Likely the rites will go through the night (several hours).   The family members were allowed to put ear buds in Javier Gonzalez's ears during CPR and shocking. The ear buds were connected to a tape player which looped Buddhist chants, so his faith tradition was strictly honored even during [employer] procedures.   Staff was very sensitive to the religious needs and traditions of the patient and his family. The Buddhist monks asked me to relay to the staff their deepest appreciation of their kindness and sensitivity.  Sallee Lange. Anisia Leija, DMin, MDiv, MA Night Chaplain

## 2015-10-29 NOTE — Progress Notes (Signed)
Pt in room extended length of time after death due to religious ceremony by [place] and Nuns. Multiple family members involved. Support given to family.

## 2015-10-29 DEATH — deceased
